# Patient Record
Sex: Male | Born: 1990
Health system: Southern US, Community
[De-identification: ages and names within clinical notes are randomized; demographics above are authoritative.]

## PROBLEM LIST (undated history)

## (undated) DIAGNOSIS — L700 Acne vulgaris: Secondary | ICD-10-CM

## (undated) DIAGNOSIS — F909 Attention-deficit hyperactivity disorder, unspecified type: Secondary | ICD-10-CM

## (undated) DIAGNOSIS — F419 Anxiety disorder, unspecified: Secondary | ICD-10-CM

## (undated) HISTORY — DX: Acne vulgaris: L70.0

## (undated) HISTORY — DX: Anxiety disorder, unspecified: F41.9

## (undated) HISTORY — DX: Attention-deficit hyperactivity disorder, unspecified type: F90.9

---
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
## 2010-01-11 ENCOUNTER — Telehealth: Payer: Self-pay | Admitting: Family Medicine

## 2010-02-13 NOTE — Progress Notes (Signed)
Summary: Stattera dosage?  Phone Note From Pharmacy   Caller: [facility]* Call For: Dr. Clent Ridges  Summary of Call: [facility] is calling questioning the Strattera dosage of 160 mg. daily.  She states the pt is telling her that he had not been taking his Strattera lately because......"he does not like the way it makes him feel".  Was Dr. Clent Ridges aware of this? Initial call taken by: Lynann Beaver CMA,  Jun 01, 2009 4:09 PM  Follow-up for Phone Call        Mom calls to make sure Dr. Clent Ridges knows pt has not been on any Strattera x one year.  Wants a different med.  Pharmacist did not want to fill this prescription [phone] Follow-up by: Lynann Beaver CMA,  Jun 01, 2009 4:19 PM  Additional Follow-up for Phone Call Additional follow up Details #1::        This is a completely different story than what the patient told me during our visit. He needs to come back in for another OV to discuss this, because now I have no idea what he wants  Additional Follow-up by: Nelwyn Salisbury MD,  Jun 02, 2009 8:27 AM    Additional Follow-up for Phone Call Additional follow up Details #2::    Appt scheduled. Follow-up by: Lynann Beaver CMA,  Jun 02, 2009 8:35 AM

## 2010-02-13 NOTE — Progress Notes (Signed)
Summary: new rx  Phone Note Refill Request Call back at [phone] Message from:  mom-kim  Refills Requested: Medication #1:  ADDERALL XR 20 MG XR24H-CAP once daily. pt needs new rx  Initial call taken by: Heron Sabins,  August 28, 2009 11:30 AM  Follow-up for Phone Call        done Follow-up by: Nelwyn Salisbury MD,  August 28, 2009 4:58 PM    New/Updated Medications: ADDERALL XR 20 MG XR24H-CAP (AMPHETAMINE-DEXTROAMPHETAMINE) once daily, may fill on 09-28-09 ADDERALL XR 20 MG XR24H-CAP (AMPHETAMINE-DEXTROAMPHETAMINE) once daily, may fill on 10-28-09 Prescriptions: ADDERALL XR 20 MG XR24H-CAP (AMPHETAMINE-DEXTROAMPHETAMINE) once daily, may fill on 10-28-09  #30 x 0   Entered and Authorized by:   Nelwyn Salisbury MD   Signed by:   Nelwyn Salisbury MD on 08/28/2009   Method used:   Print then Give to Patient   RxID:   [id number] ADDERALL XR 20 MG XR24H-CAP (AMPHETAMINE-DEXTROAMPHETAMINE) once daily, may fill on 09-28-09  #30 x 0   Entered and Authorized by:   Nelwyn Salisbury MD   Signed by:   Nelwyn Salisbury MD on 08/28/2009   Method used:   Print then Give to Patient   RxID:   [id number] ADDERALL XR 20 MG XR24H-CAP (AMPHETAMINE-DEXTROAMPHETAMINE) once daily  #30 x 0   Entered and Authorized by:   Nelwyn Salisbury MD   Signed by:   Nelwyn Salisbury MD on 08/28/2009   Method used:   Print then Give to Patient   RxID:   [id number]

## 2010-02-13 NOTE — Assessment & Plan Note (Signed)
Summary: discuss meds/dm   Vital Signs:  Patient profile:   20 year old male BP sitting:   126 / 88  (left arm) Cuff size:   regular  Vitals Entered By: Raechel Ache, RN (Jun 02, 2009 3:33 PM) CC: Talk about meds.   History of Present Illness: Here with his mother to clarify his status on anxiety and ADHD. I saw him a few days ago, when he misled me about his Strattera. He led me to believe that he had been taking it for several years uninterrupted, when he actually stopped taking it over a year ago. Today he says that he did not think it helped his attention at all, and in fact it made him very lethergic. He would like to try something different now for this problem. he is still on Effexor, and he is very pleased with how this takes care of his anxiety symptoms.   Allergies: No Known Drug Allergies  Past History:  Past Medical History: Reviewed history from 07/05/2008 and no changes required. Anxiety ADHD Osgood-Schlatters acne  Review of Systems  The patient denies anorexia, fever, weight loss, weight gain, vision loss, decreased hearing, hoarseness, chest pain, syncope, dyspnea on exertion, peripheral edema, prolonged cough, headaches, hemoptysis, abdominal pain, melena, hematochezia, severe indigestion/heartburn, hematuria, incontinence, genital sores, muscle weakness, suspicious skin lesions, transient blindness, difficulty walking, depression, unusual weight change, abnormal bleeding, enlarged lymph nodes, angioedema, breast masses, and testicular masses.    Physical Exam  General:  Well-developed,well-nourished,in no acute distress; alert,appropriate and cooperative throughout examination Neurologic:  No cranial nerve deficits noted. Station and gait are normal. Plantar reflexes are down-going bilaterally. DTRs are symmetrical throughout. Sensory, motor and coordinative functions appear intact. Psych:  Cognition and judgment appear intact. Alert and cooperative with  normal attention span and concentration. No apparent delusions, illusions, hallucinations   Impression & Recommendations:  Problem # 1:  ADHD (ICD-314.01)  Problem # 2:  ANXIETY (ICD-300.00)  His updated medication list for this problem includes:    Effexor Xr 150 Mg Cp24 (Venlafaxine hcl) ..... Once daily  Complete Medication List: 1)  Benzaclin 1-5 % Gel (Clindamycin phos-benzoyl perox) .... Two times a day 2)  Minocin 100 Mg Caps (Minocycline hcl) .Marland Kitchen.. 1 by mouth two times a day 3)  Effexor Xr 150 Mg Cp24 (Venlafaxine hcl) .... Once daily 4)  Adderall Xr 20 Mg Xr24h-cap (Amphetamine-dextroamphetamine) .... Once daily  Patient Instructions: 1)  We spent 40 minutes discussing various treatment options, and we all agreed to try a stimulant med. Try Adderall XR. Continue Effexor XR.  2)  Please schedule a follow-up appointment in 1 month.  Prescriptions: ADDERALL XR 20 MG XR24H-CAP (AMPHETAMINE-DEXTROAMPHETAMINE) once daily  #30 x 0   Entered and Authorized by:   Nelwyn Salisbury MD   Signed by:   Nelwyn Salisbury MD on 06/02/2009   Method used:   Print then Give to Patient   RxID:   [id number]

## 2010-02-13 NOTE — Progress Notes (Signed)
Summary: new rx  Phone Note Call from Patient Call back at Home Phone [phone] Call back at [phone]   Caller: Mom-kim Call For: Nelwyn Salisbury MD Summary of Call: pt needs new rx adderall xr 20 mg he has 2 pills left. mom is aware doc out of office Initial call taken by: Heron Sabins,  July 27, 2009 3:53 PM  Follow-up for Phone Call        done Follow-up by: Nelwyn Salisbury MD,  July 31, 2009 2:04 PM  Additional Follow-up for Phone Call Additional follow up Details #1::        called. Additional Follow-up by: Raechel Ache, RN,  July 31, 2009 2:12 PM    Prescriptions: ADDERALL XR 20 MG XR24H-CAP (AMPHETAMINE-DEXTROAMPHETAMINE) once daily  #30 x 0   Entered and Authorized by:   Nelwyn Salisbury MD   Signed by:   Nelwyn Salisbury MD on 07/31/2009   Method used:   Print then Give to Patient   RxID:   [id number]

## 2010-02-13 NOTE — Assessment & Plan Note (Signed)
Summary: fu on med/njr   Vital Signs:  Patient profile:   20 year old male Weight:      180 pounds BMI:     29.16 BP sitting:   132 / 84  (left arm) Cuff size:   regular  Vitals Entered By: Raechel Ache, RN (Jun 01, 2009 10:01 AM) CC: F/u meds   History of Present Illness: Here to discuss his meds. He has been back home from college for 2 weeks, and he is working part time for his church. His anxiety is well controlled, but he says the Strattera is not as effective as it once was. He would like to increase the dose if possible. No side effects to report.   Allergies: No Known Drug Allergies  Past History:  Past Medical History: Reviewed history from 07/05/2008 and no changes required. Anxiety ADHD Osgood-Schlatters acne  Review of Systems  The patient denies anorexia, fever, weight loss, weight gain, vision loss, decreased hearing, hoarseness, chest pain, syncope, dyspnea on exertion, peripheral edema, prolonged cough, headaches, hemoptysis, abdominal pain, melena, hematochezia, severe indigestion/heartburn, hematuria, incontinence, genital sores, muscle weakness, suspicious skin lesions, transient blindness, difficulty walking, depression, unusual weight change, abnormal bleeding, enlarged lymph nodes, angioedema, breast masses, and testicular masses.    Physical Exam  General:  Well-developed,well-nourished,in no acute distress; alert,appropriate and cooperative throughout examination Neurologic:  alert & oriented X3, cranial nerves II-XII intact, and gait normal.   Psych:  Cognition and judgment appear intact. Alert and cooperative with normal attention span and concentration. No apparent delusions, illusions, hallucinations   Impression & Recommendations:  Problem # 1:  ANXIETY (ICD-300.00)  His updated medication list for this problem includes:    Effexor Xr 150 Mg Cp24 (Venlafaxine hcl) ..... Once daily  Problem # 2:  ADHD (ICD-314.01)  Complete Medication  List: 1)  Benzaclin 1-5 % Gel (Clindamycin phos-benzoyl perox) .... Two times a day 2)  Strattera 100 Mg Caps (Atomoxetine hcl) .... Once daily 3)  Minocin 100 Mg Caps (Minocycline hcl) .Marland Kitchen.. 1 by mouth two times a day 4)  Strattera 100 Mg Caps (Atomoxetine hcl) .... Once daily 5)  Strattera 60 Mg Caps (Atomoxetine hcl) .... Once daily 6)  Effexor Xr 150 Mg Cp24 (Venlafaxine hcl) .... Once daily  Patient Instructions: 1)  We spent 30 minutes discussing these issues. Will increase Strattera to a total of 160 mg once daily .  2)  Please schedule a follow-up appointment in 2 weeks.  Prescriptions: STRATTERA 60 MG CAPS (ATOMOXETINE HCL) once daily  #30 x 11   Entered and Authorized by:   Nelwyn Salisbury MD   Signed by:   Nelwyn Salisbury MD on 06/01/2009   Method used:   Electronically to        [facility]* (retail)       [address]       [place], [place]  [address]       Ph: [phone]       Fax: [phone]   RxID:   [id number] STRATTERA 100 MG CAPS (ATOMOXETINE HCL) once daily  #30 x 11   Entered and Authorized by:   Nelwyn Salisbury MD   Signed by:   Nelwyn Salisbury MD on 06/01/2009   Method used:   Electronically to        [facility]* (retail)       [address]       [place], [place]  [id number]  Ph: [phone]       Fax: [phone]   RxID:   [id number]

## 2010-02-13 NOTE — Assessment & Plan Note (Signed)
Summary: flu shot//lch  Nurse Visit   Allergies: No Known Drug Allergies  Orders Added: 1)  Admin 1st Vaccine [90471] 2)  Flu Vaccine 41yrs + [04540]  Review of Systems       Flu Vaccine Consent Questions     Do you have a history of severe allergic reactions to this vaccine? no    Any prior history of allergic reactions to egg and/or gelatin? no    Do you have a sensitivity to the preservative Thimersol? no    Do you have a past history of Guillan-Barre Syndrome? no    Do you currently have an acute febrile illness? no    Have you ever had a severe reaction to latex? no    Vaccine information given and explained to patient? yes    Are you currently pregnant? no    Lot Number:[id number]   Exp Date:07/14/2010   Site Given  Left Deltoid IM Pura Spice, RN  October 27, 2009 4:07 PM

## 2010-02-13 NOTE — Progress Notes (Signed)
Summary: new rx  Phone Note Refill Request Call back at [phone] Message from:  mom-kim  Refills Requested: Medication #1:  ADDERALL XR 20 MG XR24H-CAP once daily Please call mom when ready for pick up  Initial call taken by: Heron Sabins,  December 01, 2009 10:31 AM  Follow-up for Phone Call        due to availability issues, try Vyvanse for one month Follow-up by: Nelwyn Salisbury MD,  December 01, 2009 2:37 PM  Additional Follow-up for Phone Call Additional follow up Details #1::        left mess rx ready  Additional Follow-up by: Pura Spice, RN,  December 01, 2009 3:11 PM    New/Updated Medications: VYVANSE 20 MG CAPS (LISDEXAMFETAMINE DIMESYLATE) once daily Prescriptions: VYVANSE 20 MG CAPS (LISDEXAMFETAMINE DIMESYLATE) once daily  #30 x 0   Entered and Authorized by:   Nelwyn Salisbury MD   Signed by:   Nelwyn Salisbury MD on 12/01/2009   Method used:   Print then Give to Patient   RxID:   [id number]

## 2010-02-13 NOTE — Progress Notes (Signed)
Summary: Pt req script for Adderall XR 20mg   Phone Note Call from Patient Call back at [phone] Kims cell   Caller: MOM-Kimberly Summary of Call: Pt req script for Adderall XR 20mg . Pt only has 2 pills lft. Pls notify when ready for pick up.  Initial call taken by: Lucy Antigua,  June 28, 2009 3:40 PM  Follow-up for Phone Call        done Follow-up by: Nelwyn Salisbury MD,  June 29, 2009 8:35 AM  Additional Follow-up for Phone Call Additional follow up Details #1::        called. Additional Follow-up by: Raechel Ache, RN,  June 29, 2009 8:42 AM    Prescriptions: ADDERALL XR 20 MG XR24H-CAP (AMPHETAMINE-DEXTROAMPHETAMINE) once daily  #30 x 0   Entered and Authorized by:   Nelwyn Salisbury MD   Signed by:   Nelwyn Salisbury MD on 06/29/2009   Method used:   Print then Give to Patient   RxID:   [id number]

## 2010-02-13 NOTE — Progress Notes (Signed)
Summary: no show  Phone Note Other Incoming   Summary of Call: no show for appt Initial call taken by: Raechel Ache, RN,  June 15, 2009 9:18 AM  Follow-up for Phone Call        charge the NS fee Follow-up by: Nelwyn Salisbury MD,  June 15, 2009 10:01 AM

## 2010-02-15 NOTE — Progress Notes (Signed)
Summary: Pt needs script for Adderall.   Phone Note Refill Request Call back at [phone] [facility] from:  mom Selena Batten on January 11, 2010 1:42 PM  Refills Requested: Medication #1:  ADDERALL XR 20 MG XR24H-CAP once daily.   Dosage confirmed as above?Dosage Confirmed   Supply Requested: 1 month  Method Requested: Pick up at Office Initial call taken by: Lucy Antigua,  January 11, 2010 1:42 PM  Follow-up for Phone Call        done Follow-up by: Nelwyn Salisbury MD,  January 12, 2010 4:32 PM  Additional Follow-up for Phone Call Additional follow up Details #1::        left mess that rx ready for pick up  Additional Follow-up by: Pura Spice, RN,  January 12, 2010 4:48 PM    New/Updated Medications: ADDERALL XR 20 MG XR24H-CAP (AMPHETAMINE-DEXTROAMPHETAMINE) once daily, may fill on 02-12-10 ADDERALL XR 20 MG XR24H-CAP (AMPHETAMINE-DEXTROAMPHETAMINE) once daily, may fill on 03-13-10 Prescriptions: ADDERALL XR 20 MG XR24H-CAP (AMPHETAMINE-DEXTROAMPHETAMINE) once daily, may fill on 03-13-10  #30 x 0   Entered and Authorized by:   Nelwyn Salisbury MD   Signed by:   Nelwyn Salisbury MD on 01/12/2010   Method used:   Print then Mail to Patient   RxID:   [id number] ADDERALL XR 20 MG XR24H-CAP (AMPHETAMINE-DEXTROAMPHETAMINE) once daily, may fill on 02-12-10  #30 x 0   Entered and Authorized by:   Nelwyn Salisbury MD   Signed by:   Nelwyn Salisbury MD on 01/12/2010   Method used:   Print then Mail to Patient   RxID:   [id number] ADDERALL XR 20 MG XR24H-CAP (AMPHETAMINE-DEXTROAMPHETAMINE) once daily  #30 x 0   Entered and Authorized by:   Nelwyn Salisbury MD   Signed by:   Nelwyn Salisbury MD on 01/12/2010   Method used:   Print then Mail to Patient   RxID:   [id number]

## 2010-05-09 ENCOUNTER — Other Ambulatory Visit: Payer: Self-pay | Admitting: Family Medicine

## 2010-05-09 MED ORDER — AMPHETAMINE-DEXTROAMPHET ER 20 MG PO CP24: 20.0000 mg | ORAL_CAPSULE | ORAL | Status: DC

## 2010-05-09 NOTE — Telephone Encounter (Signed)
rx up front ready for p/u, pt aware 

## 2010-05-09 NOTE — Telephone Encounter (Signed)
Done for one month only. After that he will need an OV since we need to see him at least  once a year

## 2010-05-09 NOTE — Telephone Encounter (Signed)
Pt needs Adderall XR 20 mg x3 scripts.

## 2010-05-11 ENCOUNTER — Telehealth: Payer: Self-pay | Admitting: Family Medicine

## 2010-05-11 NOTE — Telephone Encounter (Signed)
Error already processed/njr

## 2010-05-24 ENCOUNTER — Encounter: Payer: Self-pay | Admitting: Family Medicine

## 2010-05-24 ENCOUNTER — Ambulatory Visit (INDEPENDENT_AMBULATORY_CARE_PROVIDER_SITE_OTHER): Payer: Managed Care, Other (non HMO) | Admitting: Family Medicine

## 2010-05-24 VITALS — BP 128/76 | HR 71 | Temp 98.1°F | Resp 16 | Wt 169.5 lb

## 2010-05-24 DIAGNOSIS — F419 Anxiety disorder, unspecified: Secondary | ICD-10-CM

## 2010-05-24 DIAGNOSIS — F411 Generalized anxiety disorder: Secondary | ICD-10-CM

## 2010-05-24 DIAGNOSIS — F909 Attention-deficit hyperactivity disorder, unspecified type: Secondary | ICD-10-CM

## 2010-05-24 MED ORDER — AMPHETAMINE-DEXTROAMPHET ER 20 MG PO CP24: 20.0000 mg | ORAL_CAPSULE | ORAL | Status: DC

## 2010-05-24 NOTE — Progress Notes (Signed)
  Subjective:    Patient ID: Timothy Vega, male    DOB: Aug 24, 1990, 20 y.o.   MRN: [id number]  HPI Here to follow up on anxiety and ADHD. He is doing well and wants to continue his meds. He is about to finish up his sophomore year of college, and he has a job lined up for this summer.    Review of Systems  Constitutional: Negative.   Respiratory: Negative.   Cardiovascular: Negative.   Neurological: Negative.   Psychiatric/Behavioral: Negative.        Objective:   Physical Exam  Constitutional: He is oriented to person, place, and time. He appears well-developed and well-nourished.  Cardiovascular: Normal rate, regular rhythm, normal heart sounds and intact distal pulses.   Pulmonary/Chest: Effort normal and breath sounds normal.  Neurological: He is alert and oriented to person, place, and time.  Psychiatric: He has a normal mood and affect. His behavior is normal.          Assessment & Plan:  He doing well. Meds were refilled

## 2010-06-15 ENCOUNTER — Ambulatory Visit (INDEPENDENT_AMBULATORY_CARE_PROVIDER_SITE_OTHER): Payer: Managed Care, Other (non HMO) | Admitting: Family Medicine

## 2010-06-15 ENCOUNTER — Encounter: Payer: Self-pay | Admitting: Family Medicine

## 2010-06-15 VITALS — BP 124/84 | Temp 99.6°F | Wt 167.0 lb

## 2010-06-15 DIAGNOSIS — M25519 Pain in unspecified shoulder: Secondary | ICD-10-CM

## 2010-06-17 ENCOUNTER — Encounter: Payer: Self-pay | Admitting: Family Medicine

## 2010-06-17 NOTE — Progress Notes (Signed)
  Subjective:    Patient ID: Timothy Vega, male    DOB: 07/21/1990, 20 y.o.   MRN: [id number]  HPI Here for one week of right shoulder pain. No recent trauma, but he has been playing a lot of sports lately. No neck or back pain. Motrin helps.   Review of Systems  Constitutional: Negative.   Musculoskeletal: Positive for arthralgias.       Objective:   Physical Exam  Constitutional: He appears well-developed and well-nourished.  Musculoskeletal:       The right shoulder is tender posteriorly just above the scapular spine, full ROM          Assessment & Plan:  Rest, heat, Motrin. Recheck prn

## 2010-09-19 ENCOUNTER — Telehealth: Payer: Self-pay | Admitting: Family Medicine

## 2010-09-19 NOTE — Telephone Encounter (Signed)
Pt's mom calling in, requesting Rx for Adderall. Please call when available for pick up.

## 2010-09-20 MED ORDER — AMPHETAMINE-DEXTROAMPHET ER 20 MG PO CP24: 20.0000 mg | ORAL_CAPSULE | ORAL | Status: DC

## 2010-09-20 NOTE — Telephone Encounter (Signed)
Left voice message, scripts are ready for pick up.

## 2010-09-20 NOTE — Telephone Encounter (Signed)
Done

## 2010-10-15 ENCOUNTER — Ambulatory Visit (INDEPENDENT_AMBULATORY_CARE_PROVIDER_SITE_OTHER): Payer: Managed Care, Other (non HMO)

## 2010-10-15 DIAGNOSIS — Z23 Encounter for immunization: Secondary | ICD-10-CM

## 2010-11-12 ENCOUNTER — Encounter: Payer: Self-pay | Admitting: Family Medicine

## 2010-11-12 ENCOUNTER — Ambulatory Visit (INDEPENDENT_AMBULATORY_CARE_PROVIDER_SITE_OTHER): Payer: Managed Care, Other (non HMO) | Admitting: Family Medicine

## 2010-11-12 VITALS — BP 132/90 | HR 74 | Temp 99.1°F | Wt 174.0 lb

## 2010-11-12 DIAGNOSIS — J40 Bronchitis, not specified as acute or chronic: Secondary | ICD-10-CM

## 2010-11-12 MED ORDER — AZITHROMYCIN 250 MG PO TABS: ORAL_TABLET | ORAL | Status: AC

## 2010-11-12 MED ORDER — HYDROCODONE-HOMATROPINE 5-1.5 MG/5ML PO SYRP: 5.0000 mL | ORAL_SOLUTION | ORAL | Status: DC | PRN

## 2010-11-12 NOTE — Progress Notes (Signed)
  Subjective:    Patient ID: Timothy Vega, male    DOB: 03-May-1990, 20 y.o.   MRN: [id number]  HPI Here for 3 weeks of chest congestion and coughing up green sputum. No fever. On Delsym.    Review of Systems  Constitutional: Negative.   HENT: Negative.   Eyes: Negative.   Respiratory: Positive for cough.        Objective:   Physical Exam  Constitutional: He appears well-developed and well-nourished.  HENT:  Right Ear: External ear normal.  Left Ear: External ear normal.  Nose: Nose normal.  Mouth/Throat: No oropharyngeal exudate.  Eyes: Pupils are equal, round, and reactive to light.  Neck: No thyromegaly present.  Pulmonary/Chest: Effort normal and breath sounds normal. No respiratory distress. He has no wheezes. He has no rales. He exhibits no tenderness.  Lymphadenopathy:    He has no cervical adenopathy.          Assessment & Plan:  Recheck prn

## 2010-11-19 ENCOUNTER — Telehealth: Payer: Self-pay | Admitting: *Deleted

## 2010-11-19 NOTE — Telephone Encounter (Signed)
Mom calls stating pt needs a new cough med as the Delsym is not helping at all.  [place].

## 2010-11-20 NOTE — Telephone Encounter (Signed)
Call in another bottle of Hydromet 

## 2010-11-21 MED ORDER — HYDROCODONE-HOMATROPINE 5-1.5 MG/5ML PO SYRP: 5.0000 mL | ORAL_SOLUTION | ORAL | Status: AC | PRN

## 2010-11-21 NOTE — Telephone Encounter (Signed)
rx called into pharmacy

## 2010-11-22 ENCOUNTER — Telehealth: Payer: Self-pay | Admitting: Family Medicine

## 2010-11-22 NOTE — Telephone Encounter (Signed)
Mom called and said that script was not ready. I called pharmacy this morning and gave a verbal order. Left voice message for pt.

## 2010-12-05 ENCOUNTER — Other Ambulatory Visit: Payer: Self-pay | Admitting: Family Medicine

## 2010-12-05 MED ORDER — AMPHETAMINE-DEXTROAMPHET ER 20 MG PO CP24: 20.0000 mg | ORAL_CAPSULE | ORAL | Status: DC

## 2010-12-05 MED ORDER — VENLAFAXINE HCL ER 150 MG PO CP24: 150.0000 mg | ORAL_CAPSULE | Freq: Every day | ORAL | Status: DC

## 2010-12-05 NOTE — Telephone Encounter (Signed)
rx for Effexor sent to pharmacy.

## 2010-12-05 NOTE — Telephone Encounter (Signed)
The Adderall was written. Call in a one year supply of Effexor

## 2010-12-05 NOTE — Telephone Encounter (Signed)
Pt needs refill of amphetamine-dextroamphetamine (ADDERALL XR, 20MG ,) 20 MG 24 hr capsule x 3, venlafaxine (EFFEXOR-XR) 150 MG 24 hr capsule

## 2011-03-12 ENCOUNTER — Telehealth: Payer: Self-pay | Admitting: Family Medicine

## 2011-03-12 NOTE — Telephone Encounter (Signed)
Pt needs refill on Adderall. Please call when ready for pick up - thanks!

## 2011-03-13 MED ORDER — AMPHETAMINE-DEXTROAMPHET ER 20 MG PO CP24: 20.0000 mg | ORAL_CAPSULE | ORAL | Status: DC

## 2011-03-13 NOTE — Telephone Encounter (Signed)
Scripts ready and left voice message. 

## 2011-03-13 NOTE — Telephone Encounter (Signed)
done

## 2011-04-11 ENCOUNTER — Ambulatory Visit (INDEPENDENT_AMBULATORY_CARE_PROVIDER_SITE_OTHER): Payer: Managed Care, Other (non HMO) | Admitting: Family Medicine

## 2011-04-11 ENCOUNTER — Encounter: Payer: Self-pay | Admitting: Family Medicine

## 2011-04-11 VITALS — BP 126/80 | HR 113 | Temp 98.4°F | Wt 158.0 lb

## 2011-04-11 DIAGNOSIS — J329 Chronic sinusitis, unspecified: Secondary | ICD-10-CM

## 2011-04-11 MED ORDER — CEPHALEXIN 500 MG PO CAPS: 500.0000 mg | ORAL_CAPSULE | Freq: Three times a day (TID) | ORAL | Status: AC

## 2011-04-11 NOTE — Progress Notes (Signed)
  Subjective:    Patient ID: Timothy Vega, male    DOB: August 30, 1990, 20 y.o.   MRN: [id number]  HPI Here for 3 days of sinus pressure, PND, and a bad ST. No cough or fever.    Review of Systems  Constitutional: Negative.   HENT: Positive for congestion, sore throat, postnasal drip and sinus pressure. Negative for trouble swallowing.   Eyes: Negative.   Respiratory: Negative.        Objective:   Physical Exam  Constitutional: He appears well-developed and well-nourished.  HENT:  Right Ear: External ear normal.  Left Ear: External ear normal.  Nose: Nose normal.  Mouth/Throat: Oropharynx is clear and moist. No oropharyngeal exudate.  Eyes: Conjunctivae are normal.  Pulmonary/Chest: Effort normal and breath sounds normal.  Lymphadenopathy:    He has no cervical adenopathy.          Assessment & Plan:  Add Mucinex and Motrin

## 2011-06-18 ENCOUNTER — Telehealth: Payer: Self-pay | Admitting: Family Medicine

## 2011-06-18 MED ORDER — AMPHETAMINE-DEXTROAMPHET ER 20 MG PO CP24: 20.0000 mg | ORAL_CAPSULE | ORAL | Status: DC

## 2011-06-18 NOTE — Telephone Encounter (Signed)
done

## 2011-06-18 NOTE — Telephone Encounter (Signed)
Pt needs 3 new Rx's of Adderall. Please call mom when ready to pick up.

## 2011-06-18 NOTE — Telephone Encounter (Signed)
Script is ready for pick up and spoke with pt. 

## 2011-08-05 ENCOUNTER — Ambulatory Visit (INDEPENDENT_AMBULATORY_CARE_PROVIDER_SITE_OTHER): Payer: Managed Care, Other (non HMO) | Admitting: Family Medicine

## 2011-08-05 ENCOUNTER — Encounter: Payer: Self-pay | Admitting: Family Medicine

## 2011-08-05 VITALS — BP 128/86 | HR 88 | Temp 98.3°F | Wt 165.0 lb

## 2011-08-05 DIAGNOSIS — S91309A Unspecified open wound, unspecified foot, initial encounter: Secondary | ICD-10-CM

## 2011-08-05 DIAGNOSIS — S91319A Laceration without foreign body, unspecified foot, initial encounter: Secondary | ICD-10-CM

## 2011-08-05 NOTE — Progress Notes (Signed)
  Subjective:    Patient ID: Timothy Vega, male    DOB: [birth year], 21 y.o.   MRN: [id number]  HPI Here to have sutures checked. [place] he stepped on a broken oyster shell and got a laceration on the sole of the right foot. He went to a local ER and had sutures placed. He was also given a 10 day course of Augmentin. The area is still a little sore but feels much better.    Review of Systems  Constitutional: Negative.        Objective:   Physical Exam  Constitutional: He appears well-developed and well-nourished.  Skin:       The wound on the right sole looks clean with 2 mattress sutures in place. Slightly tender. About 1.5 cm long.           Assessment & Plan:  Both sutures were removed today. Finish out the antibiotic and recheck prn

## 2011-08-07 ENCOUNTER — Encounter: Payer: Self-pay | Admitting: Family Medicine

## 2011-08-07 ENCOUNTER — Ambulatory Visit (INDEPENDENT_AMBULATORY_CARE_PROVIDER_SITE_OTHER): Payer: Managed Care, Other (non HMO) | Admitting: Family Medicine

## 2011-08-07 VITALS — BP 128/80 | HR 90 | Temp 98.8°F | Wt 165.0 lb

## 2011-08-07 DIAGNOSIS — M546 Pain in thoracic spine: Secondary | ICD-10-CM

## 2011-08-07 MED ORDER — CYCLOBENZAPRINE HCL 10 MG PO TABS: 10.0000 mg | ORAL_TABLET | Freq: Three times a day (TID) | ORAL | Status: AC | PRN

## 2011-08-07 MED ORDER — DICLOFENAC SODIUM 75 MG PO TBEC: 75.0000 mg | DELAYED_RELEASE_TABLET | Freq: Two times a day (BID) | ORAL | Status: DC

## 2011-08-07 NOTE — Progress Notes (Signed)
  Subjective:    Patient ID: Timothy Vega, male    DOB: May 27, 1990, 20 y.o.   MRN: [id number]  HPI Here for one week of stiffness and pain in the middle back. No recent trauma. Using heat and Aleve. His job is fairly physical with a lot of reaching and lifting, so he was not able to work today.   Review of Systems  Constitutional: Negative.   Gastrointestinal: Negative.   Genitourinary: Negative.   Musculoskeletal: Positive for back pain.       Objective:   Physical Exam  Constitutional: He appears well-developed and well-nourished.  Musculoskeletal:       Tender with spasms in the middle thoracic region of the back. ROM is reduced           Assessment & Plan:  Off work today and tomorrow. Rest, heat, Flexeril, and Diclofenac prn

## 2011-09-30 ENCOUNTER — Telehealth: Payer: Self-pay | Admitting: Family Medicine

## 2011-09-30 MED ORDER — AMPHETAMINE-DEXTROAMPHET ER 20 MG PO CP24: 20.0000 mg | ORAL_CAPSULE | ORAL | Status: DC

## 2011-09-30 NOTE — Telephone Encounter (Signed)
Pt requesting to pick up (3) 30-day rx for Adderall. Please call when ready for pick up. Also, please put a DPR in there as well, for mother, Selena Batten.

## 2011-09-30 NOTE — Telephone Encounter (Signed)
done

## 2011-09-30 NOTE — Telephone Encounter (Signed)
Script is ready for pick up and left voice message. Pt must fill out form while he is here for release of information.

## 2011-10-07 ENCOUNTER — Ambulatory Visit: Payer: Managed Care, Other (non HMO) | Admitting: Family Medicine

## 2011-10-09 ENCOUNTER — Ambulatory Visit (INDEPENDENT_AMBULATORY_CARE_PROVIDER_SITE_OTHER): Payer: Managed Care, Other (non HMO) | Admitting: Internal Medicine

## 2011-10-09 ENCOUNTER — Ambulatory Visit: Payer: Managed Care, Other (non HMO)

## 2011-10-09 ENCOUNTER — Encounter: Payer: Self-pay | Admitting: Internal Medicine

## 2011-10-09 VITALS — BP 132/92 | Temp 98.0°F | Wt 154.0 lb

## 2011-10-09 DIAGNOSIS — J069 Acute upper respiratory infection, unspecified: Secondary | ICD-10-CM

## 2011-10-09 MED ORDER — HYDROCODONE-HOMATROPINE 5-1.5 MG/5ML PO SYRP: 5.0000 mL | ORAL_SOLUTION | Freq: Two times a day (BID) | ORAL | Status: DC | PRN

## 2011-10-09 NOTE — Assessment & Plan Note (Signed)
21 year old white male with signs and symptoms of viral upper respiratory infection. I suggested symptomatic treatment. Patient to call office if symptoms persist or worsen.

## 2011-10-09 NOTE — Patient Instructions (Addendum)
Use saline nasal spray 3-4 time daily as directed Take Mucinex DM twice daily as needed for cough Use tylenol 650 mg every 8 hrs as needed Increase fluid intake Please call our office if your symptoms do not improve or gets worse.

## 2011-10-09 NOTE — Progress Notes (Signed)
  Subjective:    Patient ID: Timothy Vega, male    DOB: 08-05-1990, 21 y.o.   MRN: [id number]  URI  This is a new problem. The current episode started in the past 7 days. There has been no fever. Associated symptoms include congestion and coughing. Pertinent negatives include no neck pain, sinus pain or wheezing. He has tried nothing for the symptoms.      Review of Systems  HENT: Positive for congestion. Negative for neck pain.   Respiratory: Positive for cough. Negative for wheezing.    Past Medical History  Diagnosis Date  . ADHD (attention deficit hyperactivity disorder)   . Anxiety     History   Social History  . Marital Status: Single    Spouse Name: N/A    Number of Children: N/A  . Years of Education: N/A   Occupational History  . Not on file.   Social History Main Topics  . Smoking status: Never Smoker   . Smokeless tobacco: Never Used  . Alcohol Use: 0.5 oz/week    1 drink(s) per week  . Drug Use: No  . Sexually Active: Not on file   Other Topics Concern  . Not on file   Social History Narrative  . No narrative on file    No past surgical history on file.  No family history on file.  No Known Allergies  Current Outpatient Prescriptions on File Prior to Visit  Medication Sig Dispense Refill  . amphetamine-dextroamphetamine (ADDERALL XR) 20 MG 24 hr capsule Take 1 capsule (20 mg total) by mouth every morning.  30 capsule  0  . venlafaxine (EFFEXOR-XR) 150 MG 24 hr capsule Take 1 capsule (150 mg total) by mouth daily.  30 capsule  11    BP 132/92  Temp 98 F (36.7 C) (Oral)  Wt 154 lb (69.854 kg)        Objective:   Physical Exam  Constitutional: He is oriented to person, place, and time. He appears well-developed and well-nourished.  HENT:  Head: Normocephalic and atraumatic.  Mouth/Throat: No oropharyngeal exudate.       Mild retraction of both tympanic membranes Mild oropharyngeal erythema  Eyes: EOM are normal. Pupils are  equal, round, and reactive to light.  Neck: Neck supple.  Cardiovascular: Normal rate, regular rhythm and normal heart sounds.   Pulmonary/Chest: Effort normal and breath sounds normal. He has no wheezes.  Lymphadenopathy:    He has no cervical adenopathy.  Neurological: He is alert and oriented to person, place, and time.          Assessment & Plan:

## 2011-12-31 ENCOUNTER — Ambulatory Visit (INDEPENDENT_AMBULATORY_CARE_PROVIDER_SITE_OTHER): Payer: Managed Care, Other (non HMO) | Admitting: Family Medicine

## 2011-12-31 ENCOUNTER — Encounter: Payer: Self-pay | Admitting: Family Medicine

## 2011-12-31 VITALS — BP 126/88 | HR 72 | Temp 98.3°F | Wt 154.0 lb

## 2011-12-31 DIAGNOSIS — F411 Generalized anxiety disorder: Secondary | ICD-10-CM

## 2011-12-31 DIAGNOSIS — Z23 Encounter for immunization: Secondary | ICD-10-CM

## 2011-12-31 DIAGNOSIS — F909 Attention-deficit hyperactivity disorder, unspecified type: Secondary | ICD-10-CM

## 2011-12-31 NOTE — Progress Notes (Signed)
  Subjective:    Patient ID: Timothy Vega, male    DOB: 30-Nov-1990, 21 y.o.   MRN: [id number]  HPI Here to follow up on anxiety and ADHD. He stopped taking Effexor about one month ago, and he has done well. He had some shakes and dizziness for a week or two but this has stopped. He feels great now and his anxiety is well controlled. His ADHD is stable. He has been seeing Dr. Campbell Stall for facial acne and she wants to start him on Accutane. She wants to be sure he is stable both emotionally and physcially before she starts this.    Review of Systems  Constitutional: Negative.   Respiratory: Negative.   Cardiovascular: Negative.   Neurological: Negative.   Psychiatric/Behavioral: Negative.        Objective:   Physical Exam  Constitutional: He is oriented to person, place, and time. He appears well-developed and well-nourished.  Cardiovascular: Normal rate, regular rhythm, normal heart sounds and intact distal pulses.   Neurological: He is alert and oriented to person, place, and time.  Skin:       Cystic acne over the face   Psychiatric: He has a normal mood and affect. His behavior is normal. Thought content normal.          Assessment & Plan:  He has done well off of Effexor, and I think it would be fine to go ahead an try the Accutane immediately. I wrote a note saying this for him to take to Dr. Danella Deis.

## 2012-01-30 ENCOUNTER — Encounter (HOSPITAL_COMMUNITY): Payer: Self-pay | Admitting: Emergency Medicine

## 2012-01-30 ENCOUNTER — Emergency Department (HOSPITAL_COMMUNITY)
Admission: EM | Admit: 2012-01-30 | Discharge: 2012-01-30 | Disposition: A | Discharge status: Home / Self Care | Payer: Managed Care, Other (non HMO) | Attending: Emergency Medicine | Admitting: Emergency Medicine

## 2012-01-30 DIAGNOSIS — L299 Pruritus, unspecified: Secondary | ICD-10-CM | POA: Insufficient documentation

## 2012-01-30 DIAGNOSIS — F411 Generalized anxiety disorder: Secondary | ICD-10-CM | POA: Insufficient documentation

## 2012-01-30 DIAGNOSIS — Z79899 Other long term (current) drug therapy: Secondary | ICD-10-CM | POA: Insufficient documentation

## 2012-01-30 DIAGNOSIS — F909 Attention-deficit hyperactivity disorder, unspecified type: Secondary | ICD-10-CM | POA: Insufficient documentation

## 2012-01-30 DIAGNOSIS — R21 Rash and other nonspecific skin eruption: Secondary | ICD-10-CM

## 2012-01-30 MED ORDER — FAMOTIDINE 20 MG PO TABS
20.0000 mg | ORAL_TABLET | Freq: Once | ORAL | Status: AC
  Administered 2012-01-30: 20 mg via ORAL
  Filled 2012-01-30: qty 1

## 2012-01-30 MED ORDER — HYDROXYZINE HCL 25 MG PO TABS: 25.0000 mg | ORAL_TABLET | Freq: Four times a day (QID) | ORAL | Status: DC

## 2012-01-30 MED ORDER — DIPHENHYDRAMINE HCL 50 MG/ML IJ SOLN
25.0000 mg | Freq: Once | INTRAMUSCULAR | Status: AC
  Administered 2012-01-30: 25 mg via INTRAMUSCULAR
  Filled 2012-01-30: qty 1

## 2012-01-30 MED ORDER — DIPHENHYDRAMINE HCL 25 MG PO CAPS: 50.0000 mg | ORAL_CAPSULE | Freq: Every evening | ORAL | Status: DC | PRN

## 2012-01-30 MED ORDER — METHYLPREDNISOLONE SODIUM SUCC 125 MG IJ SOLR
125.0000 mg | Freq: Once | INTRAMUSCULAR | Status: AC
  Administered 2012-01-30: 125 mg via INTRAMUSCULAR
  Filled 2012-01-30: qty 2

## 2012-01-30 NOTE — ED Notes (Signed)
Pt complains of "itching and breakouts to face" Pt states that he has been seen by a dermatologist and was placed on medications. Pt had an allergic reaction to medications and was then diagnosed with a bacterial infection. Pt is currently on day 2 of Cipro and states " This is getting worse"

## 2012-01-30 NOTE — ED Provider Notes (Signed)
History     CSN: [id number]  Arrival date & time 01/30/12  1323   First MD Initiated Contact with Patient 01/30/12 1325      Chief Complaint  Patient presents with  . Rash    (Consider location/radiation/quality/duration/timing/severity/associated sxs/prior treatment) HPI Comments: Patient is a 22 year old male with a 2 week history of rash. The rash started gradually and progressively worsened since the onset. The rash is located on his face. Patient has tried doxycycline and cipro without relief. The antibiotics seem to make the rash worse.  Patient is being seen by a Dermatologist, who was not able to see him today. Patient denies new exposures to soaps, lotions, detergent. Patient reports associated itching. No aggravating/alleviating factors. Patient denies fever, chills, NVD, sore throat, oral lesions, ocular involvement, throat closing, wheezing, SOB, chest pain, abdominal pain.      Past Medical History  Diagnosis Date  . ADHD (attention deficit hyperactivity disorder)   . Anxiety     History reviewed. No pertinent past surgical history.  No family history on file.  History  Substance Use Topics  . Smoking status: Never Smoker   . Smokeless tobacco: Never Used  . Alcohol Use: 0.5 oz/week    1 drink(s) per week      Review of Systems  Skin: Positive for rash.  All other systems reviewed and are negative.    Allergies  Doxycycline  Home Medications   Current Outpatient Rx  Name  Route  Sig  Dispense  Refill  . AMPHETAMINE-DEXTROAMPHET ER 20 MG PO CP24   Oral   Take 1 capsule (20 mg total) by mouth every morning.   30 capsule   0     May fill on 11-30-11   . CIPROFLOXACIN HCL 500 MG PO TABS   Oral   Take 500 mg by mouth 2 (two) times daily. Started on 01-28-11 for 10 day therapy         . VENLAFAXINE HCL ER 150 MG PO CP24   Oral   Take 1 capsule (150 mg total) by mouth daily.   30 capsule   11     BP 145/90  Pulse 86  Temp 97.6 F (36.4  C) (Oral)  Resp 20  SpO2 100%  Physical Exam  Nursing note and vitals reviewed. Constitutional: He is oriented to person, place, and time. He appears well-developed and well-nourished. No distress.  HENT:  Head: Normocephalic and atraumatic.       Generalized face reveals erythema, dry and flaking skin with scattered pustules.   Eyes: Conjunctivae normal are normal.  Cardiovascular: Normal rate and regular rhythm.  Exam reveals no gallop and no friction rub.   No murmur heard. Pulmonary/Chest: Effort normal and breath sounds normal. He has no wheezes. He has no rales. He exhibits no tenderness.  Abdominal: Soft. There is no tenderness.  Musculoskeletal: Normal range of motion.  Neurological: He is alert and oriented to person, place, and time.       Speech is goal-oriented. Moves limbs without ataxia.   Skin: Skin is warm and dry.  Psychiatric: He has a normal mood and affect. His behavior is normal.    ED Course  Procedures (including critical care time)  Labs Reviewed - No data to display No results found.   1. Rash and nonspecific skin eruption       MDM  2:00 PM Patient will be treated as an allergic reaction to attempt relief from itching.   2:44  PM Patient reports relief after the pepcid, benadryl and solumedrol. I will discharge the patient with atarax and benadryl. Patient will have recommended follow up with your dermatologist. No further evaluation needed at this time.       Emilia Beck, PA-C 01/31/12 1538

## 2012-01-30 NOTE — ED Notes (Signed)
MD at bedside. 

## 2012-01-31 NOTE — ED Provider Notes (Signed)
Medical screening examination/treatment/procedure(s) were performed by non-physician practitioner and as supervising physician I was immediately available for consultation/collaboration.   Lyanne Co, MD 01/31/12 1600

## 2012-02-04 ENCOUNTER — Other Ambulatory Visit: Payer: Self-pay | Admitting: Family Medicine

## 2012-02-04 ENCOUNTER — Ambulatory Visit (HOSPITAL_COMMUNITY)
Admission: RE | Admit: 2012-02-04 | Discharge: 2012-02-04 | Disposition: A | Discharge status: Home / Self Care | Payer: 59 | Attending: Psychiatry | Admitting: Psychiatry

## 2012-02-04 ENCOUNTER — Encounter (HOSPITAL_COMMUNITY): Payer: Self-pay | Admitting: *Deleted

## 2012-02-04 DIAGNOSIS — F410 Panic disorder [episodic paroxysmal anxiety] without agoraphobia: Secondary | ICD-10-CM | POA: Insufficient documentation

## 2012-02-04 NOTE — BH Assessment (Addendum)
Assessment Note   Timothy Vega is an 22 y.o. male. Pt presents for assessment of anger, crying, mood swings, depression and feelings of worthlessness. Pt is Alert and oriented dressed appropriately for season, pleasant, anxious, cooperative and well spoken. Pt Dx with facial skin infection 12/19/2011 (appearance of severe acne), treatment with antibiotics has been problematic with allergic reactions and diarrhea. Pt has been getting progressively more depressed and having anxiety panic episodes. Episodes include anger, screaming cursing, nausea, beating head with his hands.He is guilty about his actions frightening his mother and about not being himself. States he has not been able to feel any joy since mid December.  Also reports crying daily, angry whenever he sees other people be happy, avoiding friends. He listens to music to distract himself but is being interrupted by hearing voices, can't identify words but he imagines are critical of how he looks. Attending college, has 5 roommates and mother is supportive. No frank SI during panic attacks feels it would be better to die, no intent or plan. No HI now or in past. Discussed case with Thurman Coyer RN [facility] and Sheela Stack MD, pt referred to Psych IOP, message left for Jeri Modena of IOP. Does not have Advanced Directives and too overwhelmed to consider today. Given suicide prevention information and aware of 24 hour availability of emergency services here, in EDs and [place]. Pt seen for MSE by Sheela Stack MD, see ROS in MD progress note.  Axis I: Panic Disorder Axis II: Deferred Axis III:  Past Medical History  Diagnosis Date  . ADHD (attention deficit hyperactivity disorder)   . Anxiety   . Skin infection     Treated for past 6 weeks Dr Salome Holmes   Axis IV: other psychosocial or environmental problems and problems with access to health care services Axis V: 51-60 moderate symptoms  Past Medical History:  Past Medical History  Diagnosis Date  .  ADHD (attention deficit hyperactivity disorder)   . Anxiety   . Skin infection     Treated for past 6 weeks Dr Salome Holmes    No past surgical history on file.  Family History: No family history on file.  Social History:  reports that he has never smoked. He has never used smokeless tobacco. He reports that he drinks about .5 ounces of alcohol per week. He reports that he does not use illicit drugs.  Additional Social History:  Alcohol / Drug Use Pain Medications: not abusing Prescriptions: not abusing Over the Counter: not abusing History of alcohol / drug use?: No history of alcohol / drug abuse  CIWA: CIWA-Ar BP: 138/87 mmHg Pulse Rate: 89  COWS:    Allergies:  Allergies  Allergen Reactions  . Ciprocinonide (Fluocinolone)     extreme diaphorisis  . Doxycycline Rash    Home Medications:  (Not in a hospital admission)  OB/GYN Status:  No LMP for male patient.  General Assessment Data Location of Assessment: [facility] Living Arrangements: Non-relatives/Friends (5 college roomates) Can pt return to current living arrangement?: Yes Is patient capable of signing voluntary admission?: Yes Referral Source: Self/Family/Friend  Education Status Is patient currently in school?: Yes Current Grade: 16 Highest grade of school patient has completed: 15 Name of school: [employer]  Risk to self Suicidal Ideation: No (would not mind if he died only while having panic attack) Suicidal Intent: No Is patient at risk for suicide?: No Suicidal Plan?: No Access to Means: No What has been your use of drugs/alcohol within  the last 12 months?: none Previous Attempts/Gestures: No How many times?: 0  Other Self Harm Risks: panic attacks Intentional Self Injurious Behavior: None Family Suicide History: No (an Uncle died of SA issues and had bipolar/schiz DX) Recent stressful life event(s): Recent negative physical changes Persecutory voices/beliefs?: No Depression:  Yes Depression Symptoms: Insomnia;Tearfulness;Isolating;Feeling angry/irritable Substance abuse history and/or treatment for substance abuse?: No Suicide prevention information given to non-admitted patients: Yes  Risk to Others Homicidal Ideation: No Thoughts of Harm to Others: No Current Homicidal Intent: No Current Homicidal Plan: No Access to Homicidal Means: No Identified Victim: none History of harm to others?: No Assessment of Violence: None Noted Violent Behavior Description: yelling cursing, beating head with hands Does patient have access to weapons?: No Criminal Charges Pending?: No Does patient have a court date: No  Psychosis Hallucinations: Auditory (feels like people are speaking when he is listening to music) Delusions: None noted  Mental Status Report Appear/Hygiene: Other (Comment) (neat clean, appropriate for season) Eye Contact: Good Motor Activity: Unremarkable Speech: Logical/coherent Level of Consciousness: Alert Mood: Anxious;Guilty (guilt about frightening his mother) Affect: Anxious;Depressed Anxiety Level: Moderate Thought Processes: Coherent;Relevant Judgement: Unimpaired Orientation: Person;Place;Time;Situation Obsessive Compulsive Thoughts/Behaviors: None  Cognitive Functioning Concentration: Decreased Memory: Recent Intact;Remote Intact IQ: Average Insight: Fair Impulse Control: Good Appetite: Good (varies with his anxiety) Weight Loss:  (+5, -5 no overall change) Sleep: Decreased Total Hours of Sleep: 6  (3 hours at a time) Vegetative Symptoms: None  ADLScreening [facility] Services) Patient's cognitive ability adequate to safely complete daily activities?: Yes Patient able to express need for assistance with ADLs?: Yes Independently performs ADLs?: Yes (appropriate for developmental age)  Abuse/Neglect [facility]) Physical Abuse: Denies Verbal Abuse: Denies Sexual Abuse: Denies  Prior Inpatient Therapy Prior Inpatient  Therapy: No  Prior Outpatient Therapy Prior Outpatient Therapy: Yes Prior Therapy Dates: 6th grade Prior Therapy Facilty/Provider(s): nos (PCP currently) Reason for Treatment: anxiety/panic  ADL Screening (condition at time of admission) Patient's cognitive ability adequate to safely complete daily activities?: Yes Patient able to express need for assistance with ADLs?: Yes Independently performs ADLs?: Yes (appropriate for developmental age) Weakness of Legs: None Weakness of Arms/Hands: None  Home Assistive Devices/Equipment Home Assistive Devices/Equipment: None    Abuse/Neglect Assessment (Assessment to be complete while patient is alone) Physical Abuse: Denies Verbal Abuse: Denies Sexual Abuse: Denies Exploitation of patient/patient's resources: Denies Self-Neglect: Denies     Merchant [employer] officer (For Healthcare) Advance Directive: Patient does not have advance directive;Patient would not like information Pre-existing out of facility DNR order (yellow form or pink MOST form): No Nutrition Screen- [facility] Adult/[facility]/AP Patient's home diet: Regular Have you recently lost weight without trying?: No Have you been eating poorly because of a decreased appetite?: No Malnutrition Screening Tool Score: 0   Additional Information 1:1 In Past 12 Months?: No CIRT Risk: No Elopement Risk: No Does patient have medical clearance?: No     Disposition:  Disposition Disposition of Patient: Outpatient treatment Type of outpatient treatment: Psych Intensive Outpatient  On Site Evaluation by:   Reviewed with Physician:     Conan Bowens 02/04/2012 6:38 PM

## 2012-02-04 NOTE — Progress Notes (Addendum)
Patient ID: Timothy Vega, male   DOB: Jun 21, 1990, 22 y.o.   MRN: [id number] Review of Systems  Constitutional: Negative.   HENT: Negative.   Eyes: Negative.   Respiratory: Negative.   Cardiovascular: Negative.   Gastrointestinal: Positive for nausea, vomiting and diarrhea.  Genitourinary: Negative.   Musculoskeletal: Negative.   Skin: Positive for rash.  Neurological: Negative.   Endo/Heme/Allergies: Negative.   Psychiatric/Behavioral: Positive for depression. Negative for suicidal ideas. The patient is nervous/anxious and has insomnia.    Pt seen for medical screening. No active suicidal or homicidal thoughts or plan.  Patient has skin infection and taken multiple antibiotics. Loosing weight due to diarrhea. Schedule to see PCP Dr fry this week. Patient does not meet criteria for inpatient. He does not have an emergency medical condition. Recommended IOP, which he agreed. No other physical complaints.

## 2012-02-05 ENCOUNTER — Telehealth: Payer: Self-pay | Admitting: Family Medicine

## 2012-02-05 MED ORDER — VENLAFAXINE HCL ER 150 MG PO CP24: 150.0000 mg | ORAL_CAPSULE | Freq: Every day | ORAL | Status: DC

## 2012-02-05 NOTE — Telephone Encounter (Signed)
Patient's mom called stating that her son saw the group Therapist, [place] S[place] at [facility] and she states that Nic will not see the Psychiatrist until Feb 18th and asked her to call and request at least a one mth refill until the appointment. Please advise/assist. venlafaxine (EFFEXOR-XR) 150 MG 24 hr capsule [[id number] 1 capsule (150 mg total) by mouth daily.

## 2012-02-05 NOTE — Telephone Encounter (Signed)
I sent script e-scribe and spoke with pt. 

## 2012-02-05 NOTE — Telephone Encounter (Signed)
Call in #[phone]  

## 2012-02-05 NOTE — Telephone Encounter (Signed)
Patient's pharmacy is [facility]

## 2012-03-10 ENCOUNTER — Ambulatory Visit (INDEPENDENT_AMBULATORY_CARE_PROVIDER_SITE_OTHER): Payer: Managed Care, Other (non HMO) | Admitting: Family Medicine

## 2012-03-10 ENCOUNTER — Encounter: Payer: Self-pay | Admitting: Family Medicine

## 2012-03-10 VITALS — BP 118/70 | HR 61 | Temp 97.6°F | Wt 158.0 lb

## 2012-03-10 DIAGNOSIS — L700 Acne vulgaris: Secondary | ICD-10-CM | POA: Insufficient documentation

## 2012-03-10 DIAGNOSIS — L708 Other acne: Secondary | ICD-10-CM

## 2012-03-10 DIAGNOSIS — F411 Generalized anxiety disorder: Secondary | ICD-10-CM

## 2012-03-10 DIAGNOSIS — F909 Attention-deficit hyperactivity disorder, unspecified type: Secondary | ICD-10-CM

## 2012-03-10 MED ORDER — VENLAFAXINE HCL ER 150 MG PO CP24: 150.0000 mg | ORAL_CAPSULE | Freq: Every day | ORAL | Status: DC

## 2012-03-10 MED ORDER — ISOTRETINOIN 40 MG PO CAPS: 40.0000 mg | ORAL_CAPSULE | Freq: Every day | ORAL | Status: DC

## 2012-03-10 MED ORDER — AMPHETAMINE-DEXTROAMPHET ER 20 MG PO CP24: 20.0000 mg | ORAL_CAPSULE | ORAL | Status: DC

## 2012-03-10 NOTE — Progress Notes (Signed)
  Subjective:    Patient ID: Timothy Vega, male    DOB: February 28, 1990, 22 y.o.   MRN: [id number]  HPI Here to follow up. His ADHD is stable on Adderall. He had stopped Effexor for awhile but found that his anxiety flared up again, so he has started back on this again. Now the anxiety is well controlled. He has been on Accutane for one month and is doing well.    Review of Systems  Constitutional: Negative.   Neurological: Negative.   Psychiatric/Behavioral: Negative.        Objective:   Physical Exam  Constitutional: He is oriented to person, place, and time. He appears well-developed and well-nourished.  Neurological: He is alert and oriented to person, place, and time.  Skin:  Cystic acne with bright erythema over the face   Psychiatric: He has a normal mood and affect. His behavior is normal. Thought content normal.          Assessment & Plan:  He is doing well. Meds were refilled

## 2012-05-21 ENCOUNTER — Encounter: Payer: Self-pay | Admitting: Family Medicine

## 2012-05-21 ENCOUNTER — Ambulatory Visit (INDEPENDENT_AMBULATORY_CARE_PROVIDER_SITE_OTHER): Payer: Managed Care, Other (non HMO) | Admitting: Family Medicine

## 2012-05-21 VITALS — BP 104/72 | Temp 98.4°F | Wt 161.0 lb

## 2012-05-21 DIAGNOSIS — J309 Allergic rhinitis, unspecified: Secondary | ICD-10-CM

## 2012-05-21 MED ORDER — MOMETASONE FUROATE 50 MCG/ACT NA SUSP: 2.0000 | Freq: Every day | NASAL | Status: DC

## 2012-05-21 NOTE — Progress Notes (Signed)
Chief Complaint  Patient presents with  . URI    cough, congestion(head and chest)     HPI:  Acute visit for URI: -started: 3 days -symptoms:nasal congestion, sore throat, cough, sneezing, eyes itchy -denies:fever, SOB, NVD, tooth pain, ear pain -has tried: has tried OTC allergy medications -sick contacts: none known -Hx of: allergies, never tested -denies listed allergy to steroids - has used steroid creams for skin and has been fine with them   ROS: See pertinent positives and negatives per HPI.  Past Medical History  Diagnosis Date  . ADHD (attention deficit hyperactivity disorder)   . Anxiety   . Acne vulgaris     sees Dr. Campbell Stall     No family history on file.  History   Social History  . Marital Status: Single    Spouse Name: N/A    Number of Children: N/A  . Years of Education: N/A   Social History Main Topics  . Smoking status: Never Smoker   . Smokeless tobacco: Never Used  . Alcohol Use: 0.5 oz/week    1 drink(s) per week     Comment: rarely  . Drug Use: No  . Sexually Active: Not Currently   Other Topics Concern  . None   Social History Narrative  . None    Current outpatient prescriptions:amphetamine-dextroamphetamine (ADDERALL XR) 20 MG 24 hr capsule, Take 1 capsule (20 mg total) by mouth every morning., Disp: 30 capsule, Rfl: 0;  ISOtretinoin (ACCUTANE) 40 MG capsule, Take 1 capsule (40 mg total) by mouth daily., Disp: 30 capsule, Rfl: 0;  venlafaxine XR (EFFEXOR-XR) 150 MG 24 hr capsule, Take 1 capsule (150 mg total) by mouth daily., Disp: 30 capsule, Rfl: 11 mometasone (NASONEX) 50 MCG/ACT nasal spray, Place 2 sprays into the nose daily., Disp: 17 g, Rfl: 12  EXAM:  Filed Vitals:   05/21/12 1542  BP: 104/72  Temp: 98.4 F (36.9 C)    There is no height on file to calculate BMI.  GENERAL: vitals reviewed and listed above, alert, oriented, appears well hydrated and in no acute distress  HEENT: atraumatic, conjunttiva mildly  irritated, allergic shiners, no obvious abnormalities on inspection of external nose and ears, normal appearance of ear canals and TMs, clear nasal congestion pale boggy trubinates, mild post oropharyngeal erythema with PND, cobblestoning, no tonsillar edema or exudate, no sinus TTP  NECK: no obvious masses on inspection  LUNGS: clear to auscultation bilaterally, no wheezes, rales or rhonchi, good air movement  CV: HRRR, no peripheral edema  MS: moves all extremities without noticeable abnormality  PSYCH: pleasant and cooperative, no obvious depression or anxiety  ASSESSMENT AND PLAN:  Discussed the following assessment and plan:  Allergic rhinitis - Plan: mometasone (NASONEX) 50 MCG/ACT nasal spray  -start zyrtec and nasal spray, afrin short term then stop to prevent rebound, return precautions -follow up in 1 month -Patient advised to return or notify a doctor immediately if symptoms worsen or persist or new concerns arise.  Patient Instructions  Start zyrtec every night  Start nasonex daily  Use Afrin as instructed for 4 days then STOP  Follow up in 1 month or sooner if concerns     Bennye Nix R.

## 2012-05-21 NOTE — Patient Instructions (Addendum)
Start zyrtec every night  Start nasonex daily  Use Afrin as instructed for 4 days then STOP  Follow up in 1 month or sooner if concerns

## 2012-07-24 ENCOUNTER — Telehealth: Payer: Self-pay | Admitting: Family Medicine

## 2012-07-24 MED ORDER — AMPHETAMINE-DEXTROAMPHET ER 20 MG PO CP24: 20.0000 mg | ORAL_CAPSULE | ORAL | Status: DC

## 2012-07-24 NOTE — Telephone Encounter (Signed)
Pt needs new rx generic adderall xr 20 mg. Pt is out. Pt mother is aware MD out of office. Pt needs new rx today

## 2012-07-24 NOTE — Telephone Encounter (Signed)
OK  Rx  30 #  Further  Refills from pcp

## 2012-07-24 NOTE — Telephone Encounter (Signed)
Script is ready for pick up and I left a voice message for pt. 

## 2012-07-27 ENCOUNTER — Emergency Department (HOSPITAL_COMMUNITY)
Admission: EM | Admit: 2012-07-27 | Discharge: 2012-07-27 | Disposition: A | Discharge status: Home / Self Care | Payer: Managed Care, Other (non HMO) | Attending: Emergency Medicine | Admitting: Emergency Medicine

## 2012-07-27 ENCOUNTER — Emergency Department (HOSPITAL_COMMUNITY): Payer: Managed Care, Other (non HMO)

## 2012-07-27 ENCOUNTER — Encounter (HOSPITAL_COMMUNITY): Payer: Self-pay | Admitting: *Deleted

## 2012-07-27 DIAGNOSIS — IMO0002 Reserved for concepts with insufficient information to code with codable children: Secondary | ICD-10-CM | POA: Insufficient documentation

## 2012-07-27 DIAGNOSIS — Y92410 Unspecified street and highway as the place of occurrence of the external cause: Secondary | ICD-10-CM | POA: Insufficient documentation

## 2012-07-27 DIAGNOSIS — Z872 Personal history of diseases of the skin and subcutaneous tissue: Secondary | ICD-10-CM | POA: Insufficient documentation

## 2012-07-27 DIAGNOSIS — F909 Attention-deficit hyperactivity disorder, unspecified type: Secondary | ICD-10-CM | POA: Insufficient documentation

## 2012-07-27 DIAGNOSIS — Z79899 Other long term (current) drug therapy: Secondary | ICD-10-CM | POA: Insufficient documentation

## 2012-07-27 DIAGNOSIS — F411 Generalized anxiety disorder: Secondary | ICD-10-CM | POA: Insufficient documentation

## 2012-07-27 DIAGNOSIS — S6990XA Unspecified injury of unspecified wrist, hand and finger(s), initial encounter: Secondary | ICD-10-CM | POA: Insufficient documentation

## 2012-07-27 DIAGNOSIS — S6390XA Sprain of unspecified part of unspecified wrist and hand, initial encounter: Secondary | ICD-10-CM | POA: Insufficient documentation

## 2012-07-27 DIAGNOSIS — Y9389 Activity, other specified: Secondary | ICD-10-CM | POA: Insufficient documentation

## 2012-07-27 DIAGNOSIS — S59909A Unspecified injury of unspecified elbow, initial encounter: Secondary | ICD-10-CM | POA: Insufficient documentation

## 2012-07-27 DIAGNOSIS — S6392XA Sprain of unspecified part of left wrist and hand, initial encounter: Secondary | ICD-10-CM

## 2012-07-27 MED ORDER — NAPROXEN 375 MG PO TABS: 375.0000 mg | ORAL_TABLET | Freq: Two times a day (BID) | ORAL | Status: DC

## 2012-07-27 NOTE — ED Notes (Signed)
Ortho tech made aware of splint 

## 2012-07-27 NOTE — ED Notes (Signed)
Ice pack given
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
## 2012-07-28 NOTE — ED Provider Notes (Signed)
Medical screening examination/treatment/procedure(s) were conducted as a shared visit with non-physician practitioner(s) or resident  and myself.  I personally evaluated the patient during the encounter and agree with the findings and plan unless otherwise indicated.   Enid Skeens, MD 07/28/12 [phone]

## 2012-08-03 ENCOUNTER — Encounter: Payer: Self-pay | Admitting: Family Medicine

## 2012-08-03 ENCOUNTER — Ambulatory Visit (INDEPENDENT_AMBULATORY_CARE_PROVIDER_SITE_OTHER): Payer: Managed Care, Other (non HMO) | Admitting: Family Medicine

## 2012-08-03 DIAGNOSIS — Z5189 Encounter for other specified aftercare: Secondary | ICD-10-CM
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
## 2012-08-31 ENCOUNTER — Ambulatory Visit (INDEPENDENT_AMBULATORY_CARE_PROVIDER_SITE_OTHER): Payer: Managed Care, Other (non HMO) | Admitting: Family Medicine

## 2012-08-31 ENCOUNTER — Encounter: Payer: Self-pay | Admitting: Family Medicine

## 2012-08-31 DIAGNOSIS — Z5189 Encounter for other specified aftercare: Secondary | ICD-10-CM
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
## 2012-10-09 ENCOUNTER — Ambulatory Visit (INDEPENDENT_AMBULATORY_CARE_PROVIDER_SITE_OTHER): Payer: Managed Care, Other (non HMO)

## 2012-10-09 DIAGNOSIS — Z23 Encounter for immunization: Secondary | ICD-10-CM

## 2013-02-05 ENCOUNTER — Ambulatory Visit: Payer: Self-pay | Admitting: Family Medicine

## 2013-04-02 ENCOUNTER — Other Ambulatory Visit: Payer: Self-pay | Admitting: Family Medicine

## 2013-05-28 ENCOUNTER — Encounter: Payer: Self-pay | Admitting: Family Medicine

## 2013-05-28 ENCOUNTER — Ambulatory Visit (INDEPENDENT_AMBULATORY_CARE_PROVIDER_SITE_OTHER): Payer: Managed Care, Other (non HMO) | Admitting: Family Medicine

## 2013-05-28 VITALS — BP 139/90 | HR 70 | Temp 97.8°F | Ht 67.0 in | Wt 158.0 lb

## 2013-05-28 DIAGNOSIS — F909 Attention-deficit hyperactivity disorder, unspecified type: Secondary | ICD-10-CM

## 2013-05-28 DIAGNOSIS — F411 Generalized anxiety disorder: Secondary | ICD-10-CM

## 2013-05-28 MED ORDER — VENLAFAXINE HCL ER 75 MG PO CP24: 75.0000 mg | ORAL_CAPSULE | Freq: Every day | ORAL | Status: DC

## 2013-05-28 MED ORDER — VENLAFAXINE HCL ER 150 MG PO CP24: 150.0000 mg | ORAL_CAPSULE | Freq: Every day | ORAL | Status: DC

## 2013-05-28 NOTE — Progress Notes (Signed)
Pre visit review using our clinic review tool, if applicable. No additional management support is needed unless otherwise documented below in the visit note. 

## 2013-05-28 NOTE — Progress Notes (Signed)
   Subjective:    Patient ID: Timothy Vega, male    DOB: 06/11/1990, 23 y.o.   MRN: [id number]  HPI Here to discuss his anxiety. He has been under more stress at work and he thinks his Effexor should be increased a bit. He sleeps well. He is not using Adderall right now since he is not taking classes.    Review of Systems  Constitutional: Negative.   Neurological: Negative.   Psychiatric/Behavioral: Negative.        Objective:   Physical Exam  Constitutional: He is oriented to person, place, and time. He appears well-developed and well-nourished.  Cardiovascular: Normal rate, regular rhythm, normal heart sounds and intact distal pulses.   Pulmonary/Chest: Effort normal and breath sounds normal.  Neurological: He is alert and oriented to person, place, and time.  Psychiatric: He has a normal mood and affect. His behavior is normal. Thought content normal.          Assessment & Plan:  We will increase the Effexor to a total of 225 mg daily.

## 2013-09-29 ENCOUNTER — Encounter: Payer: Self-pay | Admitting: Family Medicine

## 2013-09-29 ENCOUNTER — Ambulatory Visit (INDEPENDENT_AMBULATORY_CARE_PROVIDER_SITE_OTHER): Payer: Managed Care, Other (non HMO) | Admitting: Family Medicine

## 2013-09-29 VITALS — BP 124/76 | HR 59 | Temp 98.2°F | Ht 67.0 in | Wt 165.0 lb

## 2013-09-29 DIAGNOSIS — J209 Acute bronchitis, unspecified: Secondary | ICD-10-CM

## 2013-09-29 MED ORDER — AZITHROMYCIN 250 MG PO TABS: ORAL_TABLET | ORAL | Status: DC

## 2013-09-29 NOTE — Progress Notes (Signed)
   Subjective:    Patient ID: Timothy Vega, male    DOB: 10-02-1990, 23 y.o.   MRN: [id number]  HPI Here for 3 weeks of chest congestion and a dry cough. No fever. No ST or sinus symptoms.    Review of Systems  Constitutional: Negative.   HENT: Negative.   Eyes: Negative.   Respiratory: Positive for cough.        Objective:   Physical Exam  Constitutional: He appears well-developed and well-nourished.  HENT:  Right Ear: External ear normal.  Left Ear: External ear normal.  Nose: Nose normal.  Mouth/Throat: Oropharynx is clear and moist.  Eyes: Conjunctivae are normal.  Pulmonary/Chest: Effort normal and breath sounds normal.  Lymphadenopathy:    He has no cervical adenopathy.          Assessment & Plan:  Bronchitis, possibly atypical. Treat with a Zpack

## 2013-09-29 NOTE — Progress Notes (Signed)
Pre visit review using our clinic review tool, if applicable. No additional management support is needed unless otherwise documented below in the visit note. 

## 2013-10-29 ENCOUNTER — Ambulatory Visit: Payer: Self-pay | Admitting: Family Medicine

## 2014-06-20 ENCOUNTER — Other Ambulatory Visit: Payer: Self-pay | Admitting: Family Medicine

## 2014-09-16 ENCOUNTER — Ambulatory Visit (INDEPENDENT_AMBULATORY_CARE_PROVIDER_SITE_OTHER): Payer: Managed Care, Other (non HMO)

## 2014-09-16 DIAGNOSIS — Z23 Encounter for immunization: Secondary | ICD-10-CM

## 2015-03-02 ENCOUNTER — Encounter: Payer: Self-pay | Admitting: Family Medicine

## 2015-03-02 ENCOUNTER — Ambulatory Visit (INDEPENDENT_AMBULATORY_CARE_PROVIDER_SITE_OTHER): Payer: 59 | Admitting: Family Medicine

## 2015-03-02 VITALS — BP 130/81 | HR 74 | Temp 99.0°F | Ht 67.0 in | Wt 165.0 lb

## 2015-03-02 DIAGNOSIS — J019 Acute sinusitis, unspecified: Secondary | ICD-10-CM | POA: Diagnosis not present

## 2015-03-02 MED ORDER — AZITHROMYCIN 250 MG PO TABS: ORAL_TABLET | ORAL | Status: DC

## 2015-03-02 NOTE — Progress Notes (Signed)
   Subjective:    Patient ID: Timothy Vega, male    DOB: Jan 04, 1991, 25 y.o.   MRN: [id number]  HPI Here for 3 days of sinus pressure, HA, PND, and ST. No cough.    Review of Systems  Constitutional: Positive for fever.  HENT: Positive for congestion, postnasal drip, sinus pressure and sore throat. Negative for ear pain.   Eyes: Negative.   Respiratory: Negative.        Objective:   Physical Exam  Constitutional: He appears well-developed and well-nourished.  HENT:  Right Ear: External ear normal.  Left Ear: External ear normal.  Nose: Nose normal.  Mouth/Throat: Oropharynx is clear and moist.  Eyes: Conjunctivae are normal.  Neck: No thyromegaly present.  Cardiovascular: Normal rate, regular rhythm, normal heart sounds and intact distal pulses.   Pulmonary/Chest: Effort normal and breath sounds normal.  Lymphadenopathy:    He has no cervical adenopathy.          Assessment & Plan:  Sinusitis, treat with a Zpack

## 2015-03-02 NOTE — Progress Notes (Signed)
Pre visit review using our clinic review tool, if applicable. No additional management support is needed unless otherwise documented below in the visit note. 

## 2015-07-10 ENCOUNTER — Other Ambulatory Visit: Payer: Self-pay | Admitting: Family Medicine

## 2016-08-05 ENCOUNTER — Other Ambulatory Visit: Payer: Self-pay | Admitting: Family Medicine

## 2016-09-09 ENCOUNTER — Encounter: Payer: Self-pay | Admitting: Family Medicine

## 2016-09-09 ENCOUNTER — Ambulatory Visit (INDEPENDENT_AMBULATORY_CARE_PROVIDER_SITE_OTHER): Payer: 59 | Admitting: Family Medicine

## 2016-09-09 VITALS — BP 150/90 | HR 70 | Temp 98.0°F | Ht 67.0 in | Wt 165.0 lb

## 2016-09-09 DIAGNOSIS — F900 Attention-deficit hyperactivity disorder, predominantly inattentive type: Secondary | ICD-10-CM | POA: Diagnosis not present

## 2016-09-09 DIAGNOSIS — F411 Generalized anxiety disorder: Secondary | ICD-10-CM | POA: Diagnosis not present

## 2016-09-09 MED ORDER — VENLAFAXINE HCL ER 75 MG PO CP24: ORAL_CAPSULE | ORAL | 3 refills | Status: DC

## 2016-09-09 MED ORDER — VENLAFAXINE HCL ER 150 MG PO CP24: ORAL_CAPSULE | ORAL | 3 refills | Status: DC

## 2016-09-09 NOTE — Progress Notes (Signed)
   Subjective:    Patient ID: Timothy Vega, male    DOB: [birth year], 26 y.o.   MRN: [id number]  HPI Here to follow up. He is doing well and has no concerns. His medication helps a lot and his moods are stable. He is working full time.    Review of Systems  Constitutional: Negative.   Respiratory: Negative.   Cardiovascular: Negative.   Neurological: Negative.   Psychiatric/Behavioral: Negative.        Objective:   Physical Exam  Constitutional: He is oriented to person, place, and time. He appears well-developed and well-nourished.  HENT:  Right Ear: External ear normal.  Left Ear: External ear normal.  Nose: Nose normal.  Mouth/Throat: Oropharynx is clear and moist.  Eyes: Conjunctivae are normal.  Neck: No thyromegaly present.  Cardiovascular: Normal rate, regular rhythm, normal heart sounds and intact distal pulses.   Pulmonary/Chest: Effort normal and breath sounds normal. No respiratory distress. He has no wheezes.  Lymphadenopathy:    He has no cervical adenopathy.  Neurological: He is alert and oriented to person, place, and time.  Psychiatric: He has a normal mood and affect. His behavior is normal. Thought content normal.          Assessment & Plan:  His anxiety and ADHD are stable. Meds were refilled.  Gershon Crane, MD

## 2016-09-09 NOTE — Patient Instructions (Signed)
WE NOW OFFER   Timothy Vega's FAST TRACK!!!  SAME DAY Appointments for ACUTE CARE  Such as: Sprains, Injuries, cuts, abrasions, rashes, muscle pain, joint pain, back pain Colds, flu, sore throats, headache, allergies, cough, fever  Ear pain, sinus and eye infections Abdominal pain, nausea, vomiting, diarrhea, upset stomach Animal/insect bites  3 Easy Ways to Schedule: Walk-In Scheduling Call in scheduling Mychart Sign-up: [email or web address]         

## 2017-08-12 ENCOUNTER — Ambulatory Visit (INDEPENDENT_AMBULATORY_CARE_PROVIDER_SITE_OTHER): Payer: 59 | Admitting: Family Medicine

## 2017-08-12 ENCOUNTER — Ambulatory Visit (INDEPENDENT_AMBULATORY_CARE_PROVIDER_SITE_OTHER): Payer: 59

## 2017-08-12 ENCOUNTER — Encounter: Payer: Self-pay | Admitting: Family Medicine

## 2017-08-12 VITALS — BP 140/100 | HR 64 | Temp 98.3°F | Ht 67.0 in | Wt 164.3 lb

## 2017-08-12 DIAGNOSIS — S40011A Contusion of right shoulder, initial encounter: Secondary | ICD-10-CM

## 2017-08-12 DIAGNOSIS — M25511 Pain in right shoulder: Secondary | ICD-10-CM | POA: Diagnosis not present

## 2017-08-12 NOTE — Progress Notes (Signed)
   Subjective:    Patient ID: Timothy Vega, male    DOB: 03/23/1990, 27 y.o.   MRN: [id number]  HPI Her to check on an injury to the right shoulder that occurred on 08-10-17. While riding his skateboard at a local skateboard park he fell, landing directly on the shoulder. He has been applying ice, wearing a sling, and taking Ibuprofen. He has some pain but fairly good ROM.    Review of Systems  Constitutional: Negative.   Respiratory: Negative.   Cardiovascular: Negative.   Musculoskeletal: Positive for arthralgias.  Neurological: Negative.        Objective:   Physical Exam  Constitutional: He is oriented to person, place, and time. He appears well-developed and well-nourished. No distress.  Cardiovascular: Normal rate, regular rhythm, normal heart sounds and intact distal pulses.  Pulmonary/Chest: Effort normal and breath sounds normal.  Musculoskeletal:  The right shoulder shows no swelling. There is an abrasion covered by a scab on the superior part of the shoulder. This are is moderately tender. ROM is intact. No crepitus.   Neurological: He is alert and oriented to person, place, and time.  My review of his Xrays reveal no fractures or separations.         Assessment & Plan:  Shoulder contusion. This should resolve over the next 7-10 days. Wear the sling prn. Take Ibuprofen prn.  Gershon CraneStephen Fry, MD

## 2017-10-14 ENCOUNTER — Other Ambulatory Visit: Payer: Self-pay | Admitting: Family Medicine

## 2017-11-22 ENCOUNTER — Other Ambulatory Visit: Payer: Self-pay | Admitting: Family Medicine

## 2017-11-26 ENCOUNTER — Other Ambulatory Visit: Payer: Self-pay | Admitting: Family Medicine

## 2017-11-26 NOTE — Telephone Encounter (Signed)
Requested medication (s) are due for refill today: yes  Requested medication (s) are on the active medication list: yes  Last refill:  10/15/17  Future visit scheduled: no- left message for pt to call back and make appointment  Notes to clinic:  BP 140/100 08/12/17   Requested Prescriptions  Pending Prescriptions Disp Refills   venlafaxine XR (EFFEXOR-XR) 150 MG 24 hr capsule 30 capsule 0     Psychiatry: Antidepressants - SNRI - desvenlafaxine & venlafaxine Failed - 11/26/2017  2:59 PM      Failed - LDL in normal range and within 360 days    No results found for: LDLCALC, LDLC, HIRISKLDL       Failed - Total Cholesterol in normal range and within 360 days    No results found for: CHOL, POCCHOL       Failed - Triglycerides in normal range and within 360 days    No results found for: TRIG       Failed - Last BP in normal range    BP Readings from Last 1 Encounters:  08/12/17 (!) 140/100         Failed - Valid encounter within last 6 months    Recent Outpatient Visits          3 months ago Contusion of right shoulder, initial [facility] Tera MaterStephen A, [facility]   1 year ago Anxiety Advertising account plannerstate   [facility] Tera M[facility]Stephen A, [facility]   2 years ago Acute sinusitis, recurrence not specified, unspecified location   [facility] Tera MaterStephen A, MD   4 years ago Acute bronchitis, unspecified organism   [facility] Tera MaterStephen A, MD   4 years ago Media plannerANXIETY   [facility] Tera MaterStephen A, MD

## 2017-11-26 NOTE — Telephone Encounter (Signed)
Copied from CRM [phone]. Topic: General - Other >> Nov 26, 2017  2:48 PM Ronney Lionrrington, Shykila A wrote: Medication: venlafaxine XR (EFFEXOR-XR) 150 MG 24 hr capsule   Has the patient contacted their pharmacy? Yes  Preferred Pharmacy (with phone number or street name): [facility] [place] [address]  [phone] (Phone) [phone] (Fax)    Agent: Please be advised that RX refills may take up to 3 business days. We ask that you follow-up with your pharmacy.

## 2017-11-26 NOTE — Telephone Encounter (Signed)
Called pt and left message to make an appointment

## 2017-11-27 NOTE — Telephone Encounter (Signed)
Patient has two different doses of this medication with the same sig. Both last filled on 10/15/17.   Ok to fill?

## 2017-11-28 ENCOUNTER — Encounter: Payer: Self-pay | Admitting: Family Medicine

## 2017-11-28 ENCOUNTER — Ambulatory Visit (INDEPENDENT_AMBULATORY_CARE_PROVIDER_SITE_OTHER): Payer: 59 | Admitting: Family Medicine

## 2017-11-28 VITALS — BP 100/80 | HR 69 | Temp 97.4°F | Ht 67.0 in | Wt 169.4 lb

## 2017-11-28 DIAGNOSIS — F900 Attention-deficit hyperactivity disorder, predominantly inattentive type: Secondary | ICD-10-CM | POA: Diagnosis not present

## 2017-11-28 MED ORDER — VENLAFAXINE HCL ER 75 MG PO CP24: 75.0000 mg | ORAL_CAPSULE | Freq: Every day | ORAL | 0 refills | Status: DC

## 2017-11-28 MED ORDER — VENLAFAXINE HCL ER 150 MG PO CP24: 150.0000 mg | ORAL_CAPSULE | Freq: Every day | ORAL | 0 refills | Status: DC

## 2017-11-28 NOTE — Progress Notes (Signed)
   Subjective:    Patient ID: Timothy Vega, male    DOB: 04/09/1990, 27 y.o.   MRN: [id number]  HPI Here to follow up on ADHD. He is doing well. The medication works well with no side effects.    Review of Systems  Constitutional: Negative.   Respiratory: Negative.   Cardiovascular: Negative.   Neurological: Negative.   Psychiatric/Behavioral: Negative.        Objective:   Physical Exam  Constitutional: He is oriented to person, place, and time. He appears well-developed and well-nourished.  Cardiovascular: Normal rate, regular rhythm, normal heart sounds and intact distal pulses.  Pulmonary/Chest: Effort normal and breath sounds normal.  Neurological: He is alert and oriented to person, place, and time.          Assessment & Plan:  ADHD, stable. meds were refilled.  Gershon CraneStephen Fry, MD

## 2018-06-03 ENCOUNTER — Ambulatory Visit (INDEPENDENT_AMBULATORY_CARE_PROVIDER_SITE_OTHER): Payer: BLUE CROSS/BLUE SHIELD | Admitting: Family Medicine

## 2018-06-03 ENCOUNTER — Ambulatory Visit: Payer: 59 | Admitting: Family Medicine

## 2018-06-03 ENCOUNTER — Ambulatory Visit (HOSPITAL_COMMUNITY)
Admission: RE | Admit: 2018-06-03 | Discharge: 2018-06-03 | Disposition: A | Discharge status: Home / Self Care | Payer: BLUE CROSS/BLUE SHIELD | Source: Ambulatory Visit | Attending: Family Medicine | Admitting: Family Medicine

## 2018-06-03 ENCOUNTER — Other Ambulatory Visit: Payer: Self-pay

## 2018-06-03 ENCOUNTER — Telehealth: Payer: Self-pay | Admitting: Family Medicine

## 2018-06-03 ENCOUNTER — Encounter: Payer: Self-pay | Admitting: Family Medicine

## 2018-06-03 VITALS — BP 118/76 | HR 88 | Temp 98.4°F | Ht 67.0 in | Wt 169.5 lb

## 2018-06-03 DIAGNOSIS — N433 Hydrocele, unspecified: Secondary | ICD-10-CM | POA: Diagnosis not present

## 2018-06-03 DIAGNOSIS — N50811 Right testicular pain: Secondary | ICD-10-CM | POA: Diagnosis not present

## 2018-06-03 DIAGNOSIS — R319 Hematuria, unspecified: Secondary | ICD-10-CM | POA: Diagnosis not present

## 2018-06-03 LAB — POC URINALSYSI DIPSTICK (AUTOMATED)
Bilirubin, UA: NEGATIVE
Blood, UA: POSITIVE
Glucose, UA: NEGATIVE
Ketones, UA: NEGATIVE
Nitrite, UA: NEGATIVE
Protein, UA: POSITIVE — AB
Spec Grav, UA: 1.025 (ref 1.010–1.025)
Urobilinogen, UA: 0.2 E.U./dL
pH, UA: 6 (ref 5.0–8.0)

## 2018-06-03 MED ORDER — CIPROFLOXACIN HCL 500 MG PO TABS: 500.0000 mg | ORAL_TABLET | Freq: Two times a day (BID) | ORAL | 0 refills | Status: DC

## 2018-06-03 NOTE — Telephone Encounter (Signed)
The Cipro- fluocinonide is apparently topical otic drop.  Doubt high risk, but let's start Cephalexin 500 mg po tid for 5 days pending urine cx.

## 2018-06-03 NOTE — Telephone Encounter (Signed)
Spoke with pharmacist and informed her the only allergies we have are Doxyxyline and Ciprocinonide. Pharmacist verbalized she would like doctor to make ultimate decision on as to if patient should take medication. Dr. Caryl Never I routed to you since you seen and rx'd this medication

## 2018-06-03 NOTE — Telephone Encounter (Signed)
Copied from CRM [id number]. Topic: Quick Communication - See Telephone Encounter >> Jun 03, 2018  9:25 AM Angela Nevin wrote: CRM for notification. See Telephone encounter for: 06/03/18.  [place], Pharmacist with [facility], calling to get clarification on the medication ciprofloxacin (CIPRO) 500 MG tablet as they have on file that patient has had reaction in the past.

## 2018-06-03 NOTE — Progress Notes (Signed)
  Subjective:     Patient ID: Timothy Vega, male   DOB: 11-21-1990, 28 y.o.   MRN: [id number]  HPI  Patient is seen with acute pain right testicle.  He states he woke up Sunday morning with some pain and mild swelling of the right testicle region.  No fever.  2 weeks ago he had some mild burning with urination but those symptoms seemed to clear.  He has had some occasional mild penile discharge.  He is monogamous.  No history of STD.  His pain was worse early in the week and is actually slightly improved today.  He denies any recent abdominal pain or flank pain.  He has history of allergy with rash to doxycycline  Past Medical History:  Diagnosis Date  . Acne vulgaris    sees Dr. Campbell Stall   . ADHD (attention deficit hyperactivity disorder)   . Anxiety    History reviewed. No pertinent surgical history.  reports that he has never smoked. He has never used smokeless tobacco. He reports current alcohol use. He reports that he does not use drugs. family history is not on file. Allergies  Allergen Reactions  . Ciprocinonide [Fluocinolone]     extreme diaphorisis  . Doxycycline Rash    Review of Systems  Constitutional: Negative for chills and fever.  Gastrointestinal: Negative for abdominal pain.  Genitourinary: Negative for flank pain and hematuria.  Musculoskeletal: Negative for back pain.  Neurological: Negative for dizziness.  Hematological: Negative for adenopathy.       Objective:   Physical Exam Constitutional:      Appearance: Normal appearance.  Cardiovascular:     Rate and Rhythm: Normal rate.  Genitourinary:    Comments: Left testicle is normal in size and nontender.  No masses.  Right testicle is slightly enlarged.  has rounded cystic type swelling along border of the testes.  Is slightly tender. Neurological:     Mental Status: He is alert.        Assessment:     Acute right testicle pain.  Doubt torsion. ?epidydymitis.    Plan:     -Set up  ultrasound scrotum with Doppler and will try to get this today -Obtain urine dipstick.  This does show some leukocytes and blood of uncertain significance.  Culture will be obtained -We will go ahead and cover with antibiotics pending further studies.  He has allergy to doxycycline.  Would normally use doxycycline in this setting- but doxy allergy and monogamous and low risk for STD. Given above, consider quinolone  Kristian Covey MD [facility]

## 2018-06-03 NOTE — Patient Instructions (Signed)
Go ahead and start the Cipro  We will call you with culture results  Go for ultrasound as instructed.

## 2018-06-04 NOTE — Telephone Encounter (Signed)
Angie called from [facility] and was informed to change the Rx to Cephalexin as ordered below by Dr Caryl Never.

## 2018-06-05 LAB — URINE CULTURE
MICRO NUMBER:: 492009
Result:: NO GROWTH
SPECIMEN QUALITY:: ADEQUATE

## 2018-07-16 ENCOUNTER — Other Ambulatory Visit: Payer: Self-pay | Admitting: Family Medicine

## 2018-07-21 NOTE — Telephone Encounter (Signed)
Dr. Sarajane Jews please advise on refill of these meds.  Thanks

## 2019-01-05 ENCOUNTER — Ambulatory Visit: Payer: BC Managed Care – PPO | Attending: Internal Medicine

## 2019-01-05 DIAGNOSIS — Z20828 Contact with and (suspected) exposure to other viral communicable diseases: Secondary | ICD-10-CM | POA: Diagnosis not present

## 2019-01-05 DIAGNOSIS — Z20822 Contact with and (suspected) exposure to covid-19: Secondary | ICD-10-CM

## 2019-01-06 LAB — NOVEL CORONAVIRUS, NAA: SARS-CoV-2, NAA: NOT DETECTED

## 2019-02-10 ENCOUNTER — Other Ambulatory Visit: Payer: Self-pay | Admitting: Family Medicine

## 2019-02-15 ENCOUNTER — Telehealth (INDEPENDENT_AMBULATORY_CARE_PROVIDER_SITE_OTHER): Payer: Managed Care, Other (non HMO) | Admitting: Family Medicine

## 2019-02-15 ENCOUNTER — Other Ambulatory Visit: Payer: Self-pay

## 2019-02-15 DIAGNOSIS — F411 Generalized anxiety disorder: Secondary | ICD-10-CM | POA: Diagnosis not present

## 2019-02-15 MED ORDER — VENLAFAXINE HCL ER 75 MG PO CP24: 75.0000 mg | ORAL_CAPSULE | Freq: Every day | ORAL | 3 refills | Status: DC

## 2019-02-15 MED ORDER — VENLAFAXINE HCL ER 150 MG PO CP24: 150.0000 mg | ORAL_CAPSULE | Freq: Every day | ORAL | 3 refills | Status: DC

## 2019-02-15 NOTE — Progress Notes (Signed)
Virtual Visit via Telephone Note  I connected with the patient on 02/15/19 at  1:45 PM EST by telephone and verified that I am speaking with the correct person using two identifiers.   I discussed the limitations, risks, security and privacy concerns of performing an evaluation and management service by telephone and the availability of in person appointments. I also discussed with the patient that there may be a patient responsible charge related to this service. The patient expressed understanding and agreed to proceed.  Location patient: home Location provider: work or home office Participants present for the call: patient, provider Patient did not have a visit in the prior 7 days to address this/these issue(s).   History of Present Illness: Here to get refills on Effexor XR. He takes this for his anxiety,and it has been working very well for him. He has no complaints.    Observations/Objective: Patient sounds cheerful and well on the phone. I do not appreciate any SOB. Speech and thought processing are grossly intact. Patient reported vitals:  Assessment and Plan: Anxiety, well controlled. Refilled Effexor XR as before.  Gershon Crane, MD   Follow Up Instructions:     [phone] 5-10 [phone] 11-20 [id number] 21-30 I did not refer this patient for an OV in the next 24 hours for this/these issue(s).  I discussed the assessment and treatment plan with the patient. The patient was provided an opportunity to ask questions and all were answered. The patient agreed with the plan and demonstrated an understanding of the instructions.   The patient was advised to call back or seek an in-person evaluation if the symptoms worsen or if the condition fails to improve as anticipated.  I provided 10 minutes of non-face-to-face time during this encounter.   Gershon Crane, MD

## 2020-01-25 IMAGING — US ULTRASOUND SCROTUM DOPPLER COMPLETE
1 series · 14 of 25 positions shown · non-contrast
Comparison: None.

CLINICAL DATA: Acute right testicular pain and swelling for 3 days

EXAM:
SCROTAL ULTRASOUND
DOPPLER ULTRASOUND OF THE TESTICLES
TECHNIQUE: Complete ultrasound examination of the testicles, epididymis, and
other scrotal structures was performed. Color and spectral Doppler
ultrasound were also utilized to evaluate blood flow to the
testicles.

[Series 1: ultrasound scrotum doppler complete · 14 of 73 slices shown]
[im 1/73]
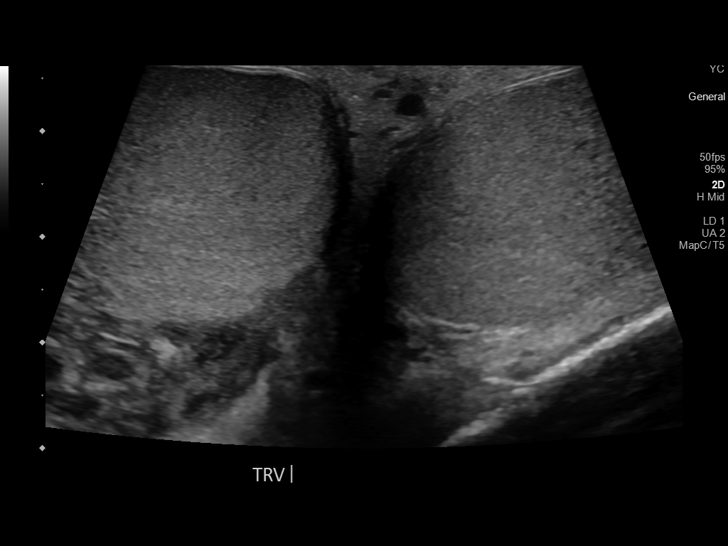
[im 7/73]
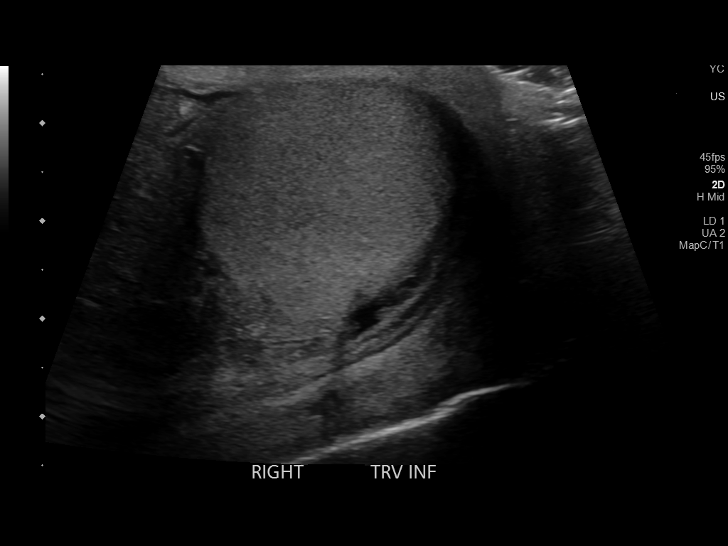
[im 13/73]
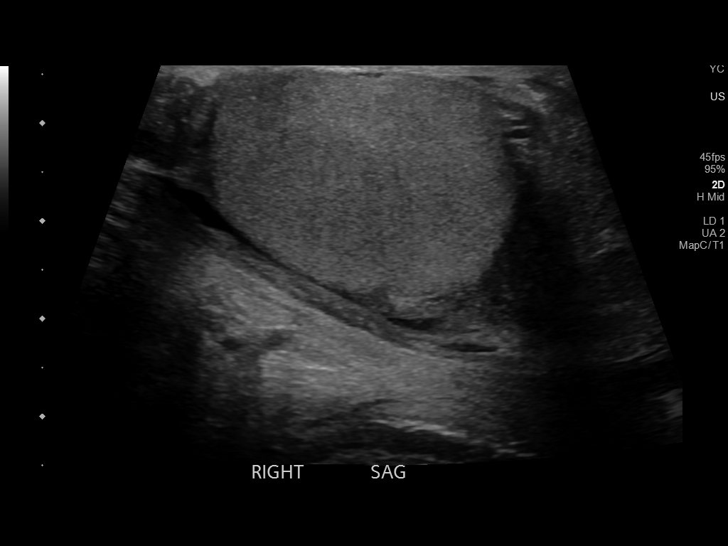
[im 19/73]
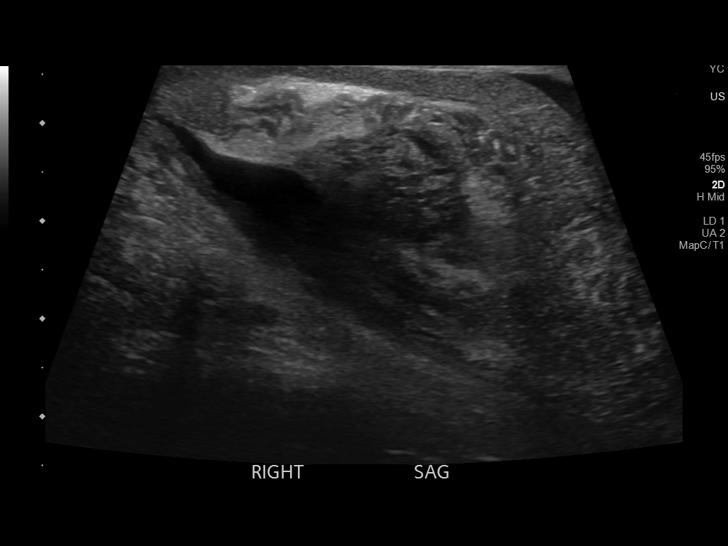
[im 25/73]
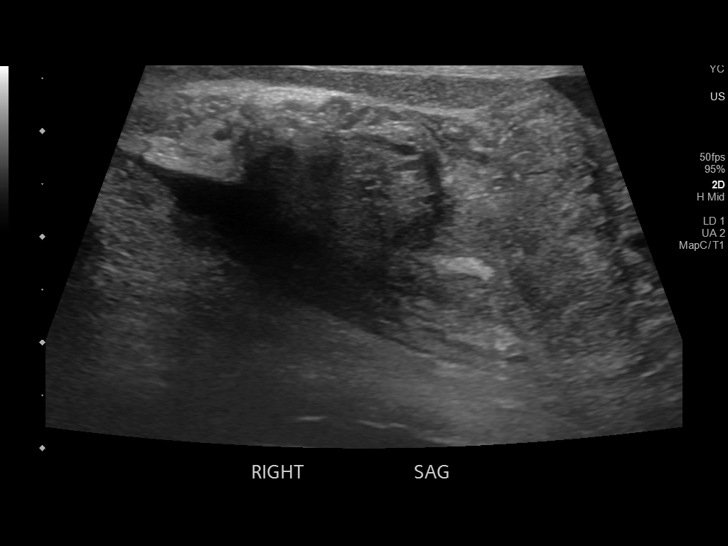
[im 28/73]
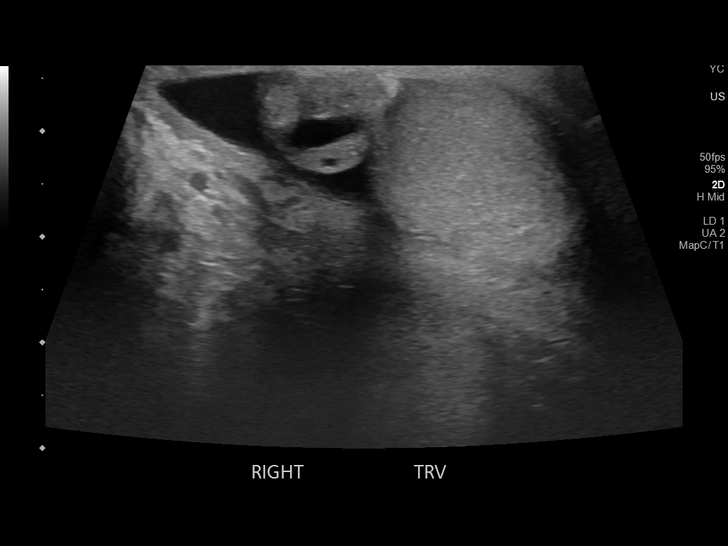
[im 34/73]
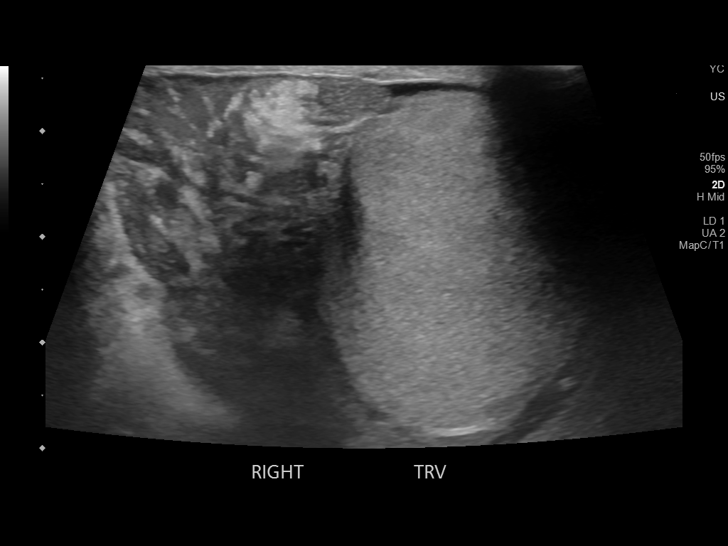
[im 40/73]
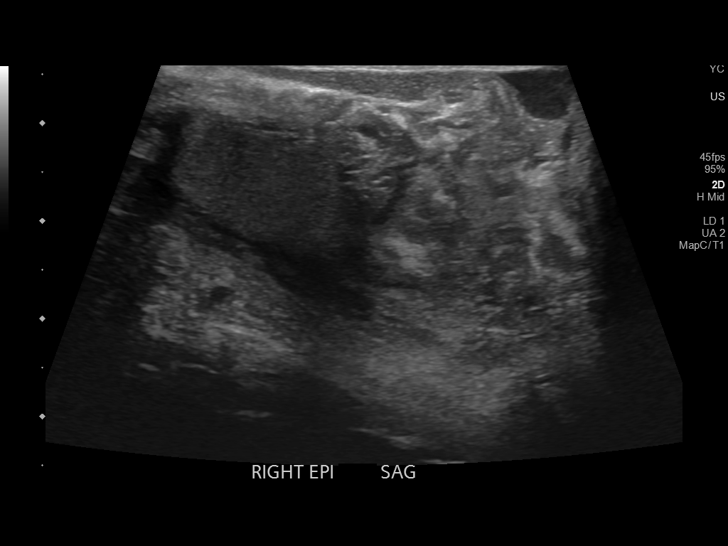
[im 46/73]
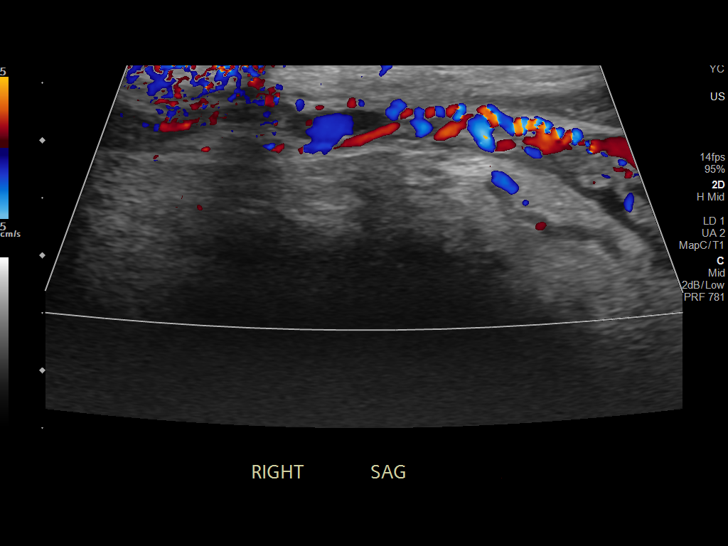
[im 49/73]
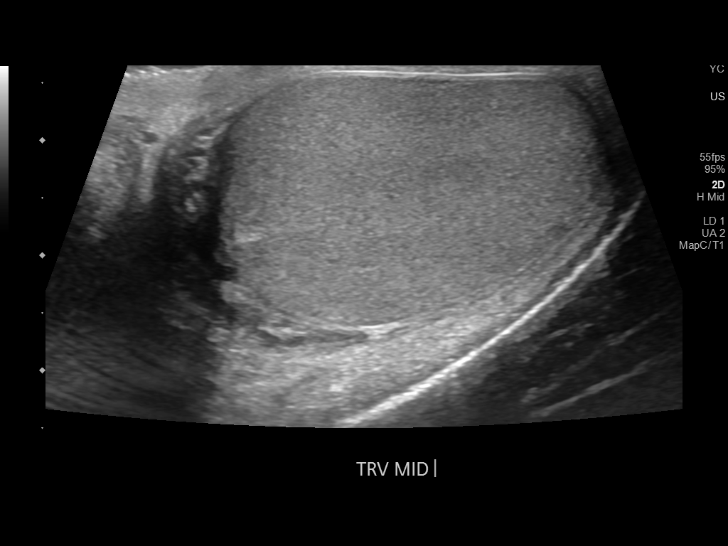
[im 55/73]
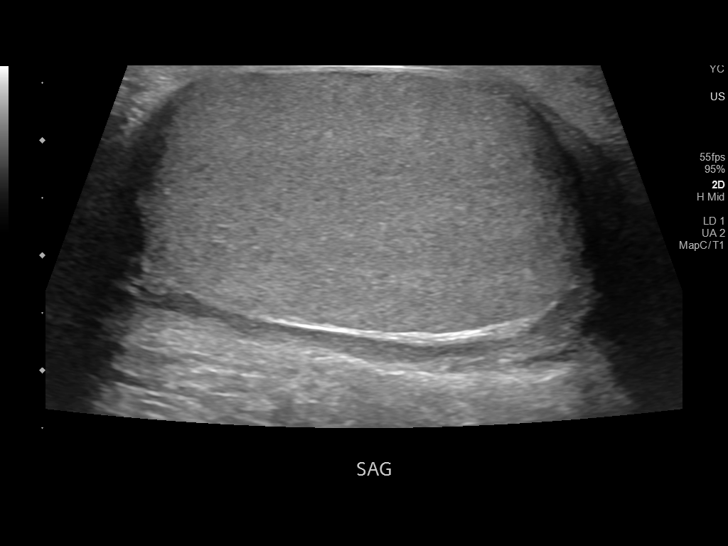
[im 61/73]
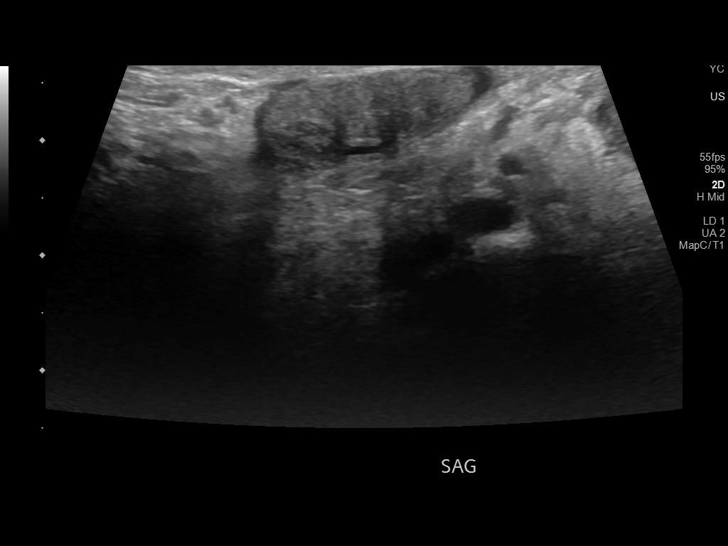
[im 67/73]
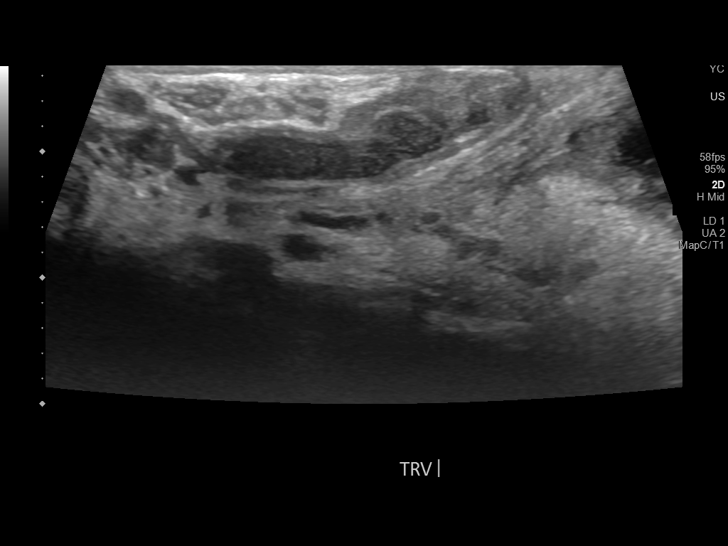
[im 73/73]
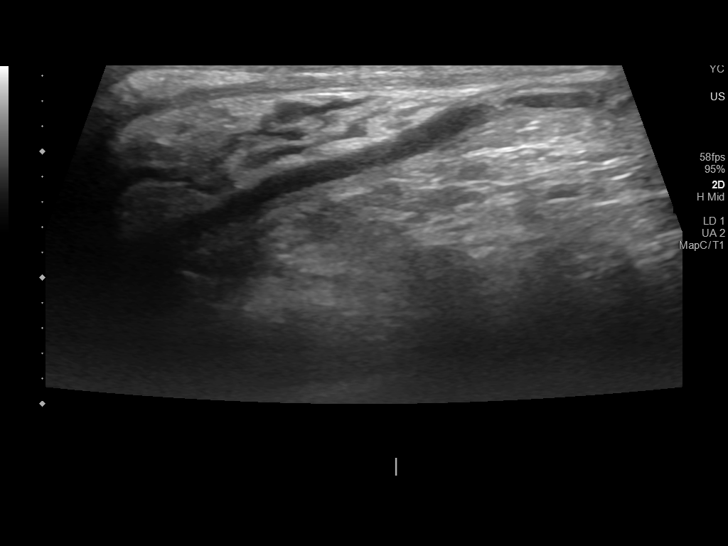

[14 of 25 positions shown; findings below may reference images not displayed]

FINDINGS: Right testicle

Measurements: 4.2 x 2.3 x 2.9 cm. No mass or microlithiasis
visualized.

Left testicle

Measurements: 4 x 2.2 x 3.2 cm. No mass or microlithiasis
visualized.

Right epididymis: Normal in size. 1.4 x 0.3 x 0.4 cm anechoic area
in the right epididymal head consistent with a and but did a
epididymal. Increased vascularity of the right epididymis relative
to the left.

Left epididymis:  Normal in size and appearance.

Hydrocele:  Small right hydrocele.

Varicocele:  None visualized.

Pulsed Doppler interrogation of both testes demonstrates normal low
resistance arterial and venous waveforms bilaterally.
IMPRESSION: 1. No testicular torsion.
2. Right epididymitis without evidence of orchitis. Small right
hydrocele.

## 2020-02-08 ENCOUNTER — Other Ambulatory Visit: Payer: Self-pay | Admitting: Family Medicine

## 2020-02-08 NOTE — Telephone Encounter (Signed)
Last office visit:  02/15/2019

## 2021-02-14 ENCOUNTER — Other Ambulatory Visit: Payer: Self-pay | Admitting: Family Medicine

## 2021-02-16 ENCOUNTER — Encounter: Payer: Self-pay | Admitting: Family Medicine

## 2021-02-16 ENCOUNTER — Ambulatory Visit (INDEPENDENT_AMBULATORY_CARE_PROVIDER_SITE_OTHER): Payer: Self-pay | Admitting: Family Medicine

## 2021-02-16 VITALS — BP 126/88 | HR 63 | Temp 98.5°F | Wt 180.0 lb

## 2021-02-16 DIAGNOSIS — F411 Generalized anxiety disorder: Secondary | ICD-10-CM

## 2021-02-16 MED ORDER — VENLAFAXINE HCL ER 150 MG PO CP24: ORAL_CAPSULE | ORAL | 3 refills | Status: DC

## 2021-02-16 MED ORDER — VENLAFAXINE HCL ER 75 MG PO CP24: ORAL_CAPSULE | ORAL | 3 refills | Status: DC

## 2021-02-16 NOTE — Progress Notes (Signed)
° °  Subjective:    Patient ID: Timothy Vega, male    DOB: Sep 14, 1990, 31 y.o.   MRN: [id number]  HPI Here to follow up on anxiety. He is doing well and he is pleased with the Effexor. No side effects to report.    Review of Systems  Constitutional: Negative.   Respiratory: Negative.    Cardiovascular: Negative.   Psychiatric/Behavioral: Negative.        Objective:   Physical Exam Constitutional:      Appearance: Normal appearance.  Cardiovascular:     Rate and Rhythm: Normal rate and regular rhythm.     Pulses: Normal pulses.     Heart sounds: Normal heart sounds.  Pulmonary:     Effort: Pulmonary effort is normal.     Breath sounds: Normal breath sounds.  Neurological:     Mental Status: He is alert.  Psychiatric:        Mood and Affect: Mood normal.        Behavior: Behavior normal.        Thought Content: Thought content normal.          Assessment & Plan:  Anxiety, stable. We refilled the Effexor XR. He will return soon for a well exam and labs.  Gershon Crane, MD

## 2021-03-06 ENCOUNTER — Ambulatory Visit (INDEPENDENT_AMBULATORY_CARE_PROVIDER_SITE_OTHER): Payer: Self-pay | Admitting: Family Medicine

## 2021-03-06 ENCOUNTER — Encounter: Payer: Self-pay | Admitting: Family Medicine

## 2021-03-06 VITALS — BP 128/82 | HR 84 | Temp 98.5°F | Ht 66.0 in | Wt 175.1 lb

## 2021-03-06 DIAGNOSIS — Z0000 Encounter for general adult medical examination without abnormal findings: Secondary | ICD-10-CM

## 2021-03-06 DIAGNOSIS — Z23 Encounter for immunization: Secondary | ICD-10-CM

## 2021-03-06 LAB — CBC WITH DIFFERENTIAL/PLATELET
Basophils Absolute: 0 10*3/uL (ref 0.0–0.1)
Basophils Relative: 0.7 % (ref 0.0–3.0)
Eosinophils Absolute: 0 10*3/uL (ref 0.0–0.7)
Eosinophils Relative: 0.6 % (ref 0.0–5.0)
HCT: 43.8 % (ref 39.0–52.0)
Hemoglobin: 14.6 g/dL (ref 13.0–17.0)
Lymphocytes Relative: 36.9 % (ref 12.0–46.0)
Lymphs Abs: 2.8 10*3/uL (ref 0.7–4.0)
MCHC: 33.3 g/dL (ref 30.0–36.0)
MCV: 91.2 fl (ref 78.0–100.0)
Monocytes Absolute: 0.6 10*3/uL (ref 0.1–1.0)
Monocytes Relative: 7.6 % (ref 3.0–12.0)
Neutro Abs: 4 10*3/uL (ref 1.4–7.7)
Neutrophils Relative %: 54.2 % (ref 43.0–77.0)
Platelets: 316 10*3/uL (ref 150.0–400.0)
RBC: 4.8 Mil/uL (ref 4.22–5.81)
RDW: 12.4 % (ref 11.5–15.5)
WBC: 7.5 10*3/uL (ref 4.0–10.5)

## 2021-03-06 LAB — BASIC METABOLIC PANEL
BUN: 12 mg/dL (ref 6–23)
CO2: 31 mEq/L (ref 19–32)
Calcium: 9.3 mg/dL (ref 8.4–10.5)
Chloride: 102 mEq/L (ref 96–112)
Creatinine, Ser: 1.01 mg/dL (ref 0.40–1.50)
GFR: 99.78 mL/min (ref >60.00)
Glucose, Bld: 89 mg/dL (ref 70–99)
Potassium: 4.5 mEq/L (ref 3.5–5.1)
Sodium: 138 mEq/L (ref 135–145)

## 2021-03-06 LAB — LIPID PANEL
Cholesterol: 208 mg/dL — ABNORMAL HIGH (ref 0–200)
HDL: 69.9 mg/dL (ref >39.00)
LDL Cholesterol: 110 mg/dL — ABNORMAL HIGH (ref 0–99)
NonHDL: 137.68
Total CHOL/HDL Ratio: 3
Triglycerides: 139 mg/dL (ref 0.0–149.0)
VLDL: 27.8 mg/dL (ref 0.0–40.0)

## 2021-03-06 LAB — HEPATIC FUNCTION PANEL
ALT: 19 U/L (ref 0–53)
AST: 22 U/L (ref 0–37)
Albumin: 4.8 g/dL (ref 3.5–5.2)
Alkaline Phosphatase: 71 U/L (ref 39–117)
Bilirubin, Direct: 0.1 mg/dL (ref 0.0–0.3)
Total Bilirubin: 0.6 mg/dL (ref 0.2–1.2)
Total Protein: 7.1 g/dL (ref 6.0–8.3)

## 2021-03-06 LAB — TSH: TSH: 0.73 u[IU]/mL (ref 0.35–5.50)

## 2021-03-06 LAB — HEMOGLOBIN A1C: Hgb A1c MFr Bld: 5.1 % (ref 4.6–6.5)

## 2021-03-06 NOTE — Progress Notes (Signed)
° °  Subjective:    Patient ID: Timothy Vega, male    DOB: 1990-11-03, 31 y.o.   MRN: [id number]  HPI Here for a well exam. He feels fine.    Review of Systems  Constitutional: Negative.   HENT: Negative.    Eyes: Negative.   Respiratory: Negative.    Cardiovascular: Negative.   Gastrointestinal: Negative.   Genitourinary: Negative.   Musculoskeletal: Negative.   Skin: Negative.   Neurological: Negative.   Psychiatric/Behavioral: Negative.        Objective:   Physical Exam Constitutional:      General: He is not in acute distress.    Appearance: Normal appearance. He is well-developed. He is not diaphoretic.  HENT:     Head: Normocephalic and atraumatic.     Right Ear: External ear normal.     Left Ear: External ear normal.     Nose: Nose normal.     Mouth/Throat:     Pharynx: No oropharyngeal exudate.  Eyes:     General: No scleral icterus.       Right eye: No discharge.        Left eye: No discharge.     Conjunctiva/sclera: Conjunctivae normal.     Pupils: Pupils are equal, round, and reactive to light.  Neck:     Thyroid: No thyromegaly.     Vascular: No JVD.     Trachea: No tracheal deviation.  Cardiovascular:     Rate and Rhythm: Normal rate and regular rhythm.     Heart sounds: Normal heart sounds. No murmur heard.   No friction rub. No gallop.  Pulmonary:     Effort: Pulmonary effort is normal. No respiratory distress.     Breath sounds: Normal breath sounds. No wheezing or rales.  Chest:     Chest wall: No tenderness.  Abdominal:     General: Bowel sounds are normal. There is no distension.     Palpations: Abdomen is soft. There is no mass.     Tenderness: There is no abdominal tenderness. There is no guarding or rebound.  Genitourinary:    Penis: Normal. No tenderness.      Testes: Normal.  Musculoskeletal:        General: No tenderness. Normal range of motion.     Cervical back: Neck supple.  Lymphadenopathy:     Cervical: No cervical  adenopathy.  Skin:    General: Skin is warm and dry.     Coloration: Skin is not pale.     Findings: No erythema or rash.  Neurological:     Mental Status: He is alert and oriented to person, place, and time.     Cranial Nerves: No cranial nerve deficit.     Motor: No abnormal muscle tone.     Coordination: Coordination normal.     Deep Tendon Reflexes: Reflexes are normal and symmetric. Reflexes normal.  Psychiatric:        Behavior: Behavior normal.        Thought Content: Thought content normal.        Judgment: Judgment normal.          Assessment & Plan:  Well exam. We discussed diet and exercise. Get fasting labs. Gershon Crane, MD

## 2021-03-06 NOTE — Addendum Note (Signed)
Addended by: Wyvonne Lenz on: 03/06/2021 11:49 AM   Modules accepted: Orders

## 2022-02-10 ENCOUNTER — Other Ambulatory Visit: Payer: Self-pay | Admitting: Family Medicine

## 2023-03-27 ENCOUNTER — Other Ambulatory Visit: Payer: Self-pay | Admitting: Family Medicine

## 2023-03-31 ENCOUNTER — Telehealth: Payer: Self-pay

## 2023-03-31 NOTE — Telephone Encounter (Signed)
 Copied from CRM [id number]. Topic: Appointments - Appointment Scheduling >> Mar 31, 2023 12:29 PM Alcus Dad wrote: Patient/patient representative is calling to schedule an appointment.Patient needs appt. Before medication refill. Patient stated that he can not go too long without his pain medication. He can barely drive with his hands. Please call back with more info

## 2023-04-02 NOTE — Telephone Encounter (Signed)
 Pt has a medication refill appointment with Dr Clent Ridges on 04/07/23

## 2023-04-07 ENCOUNTER — Encounter: Payer: Self-pay | Admitting: Family Medicine

## 2023-04-07 ENCOUNTER — Ambulatory Visit (INDEPENDENT_AMBULATORY_CARE_PROVIDER_SITE_OTHER): Payer: Self-pay | Admitting: Family Medicine

## 2023-04-07 VITALS — BP 128/80 | HR 66 | Temp 98.1°F | Ht 66.0 in | Wt 174.0 lb

## 2023-04-07 DIAGNOSIS — F411 Generalized anxiety disorder: Secondary | ICD-10-CM

## 2023-04-07 MED ORDER — VENLAFAXINE HCL ER 150 MG PO CP24: ORAL_CAPSULE | ORAL | 3 refills | Status: AC

## 2023-04-07 MED ORDER — VENLAFAXINE HCL ER 75 MG PO CP24: ORAL_CAPSULE | ORAL | 3 refills | Status: AC

## 2023-04-07 NOTE — Progress Notes (Signed)
   Subjective:    Patient ID: Timothy Vega, male    DOB: 1990/11/14, 33 y.o.   MRN: [id number]  HPI Here to follow up on anxiety. He is taking a total of 225 mg of Effexor XR daily, and he is very pleased with its effects. His moods are good. His sleep and appetite are good.    Review of Systems  Constitutional: Negative.   Respiratory: Negative.    Cardiovascular: Negative.   Psychiatric/Behavioral: Negative.         Objective:   Physical Exam Constitutional:      Appearance: Normal appearance.  Cardiovascular:     Rate and Rhythm: Normal rate.     Pulses: Normal pulses.     Heart sounds: Normal heart sounds.  Pulmonary:     Effort: Pulmonary effort is normal.     Breath sounds: Normal breath sounds.  Neurological:     Mental Status: He is alert.  Psychiatric:        Mood and Affect: Mood normal.        Behavior: Behavior normal.        Thought Content: Thought content normal.           Assessment & Plan:  His anxiety is well controlled. We refilled the Effexor XR for another year.  Gershon Crane, MD

## 2023-08-19 ENCOUNTER — Encounter: Payer: Self-pay | Admitting: Family Medicine

## 2023-08-19 ENCOUNTER — Ambulatory Visit (INDEPENDENT_AMBULATORY_CARE_PROVIDER_SITE_OTHER): Admitting: Family Medicine

## 2023-08-19 VITALS — BP 126/78 | HR 73 | Temp 98.6°F | Ht 60.25 in | Wt 164.8 lb

## 2023-08-19 DIAGNOSIS — Z131 Encounter for screening for diabetes mellitus: Secondary | ICD-10-CM

## 2023-08-19 DIAGNOSIS — Z13220 Encounter for screening for lipoid disorders: Secondary | ICD-10-CM | POA: Diagnosis not present

## 2023-08-19 DIAGNOSIS — Z0000 Encounter for general adult medical examination without abnormal findings: Secondary | ICD-10-CM

## 2023-08-19 LAB — HEMOGLOBIN A1C: Hgb A1c MFr Bld: 5.5 % (ref 4.6–6.5)

## 2023-08-19 LAB — CBC WITH DIFFERENTIAL/PLATELET
Basophils Absolute: 0 K/uL (ref 0.0–0.1)
Basophils Relative: 0.6 % (ref 0.0–3.0)
Eosinophils Absolute: 0 K/uL (ref 0.0–0.7)
Eosinophils Relative: 0.6 % (ref 0.0–5.0)
HCT: 47.2 % (ref 39.0–52.0)
Hemoglobin: 15.9 g/dL (ref 13.0–17.0)
Lymphocytes Relative: 27.6 % (ref 12.0–46.0)
Lymphs Abs: 1.7 K/uL (ref 0.7–4.0)
MCHC: 33.7 g/dL (ref 30.0–36.0)
MCV: 90.1 fl (ref 78.0–100.0)
Monocytes Absolute: 0.5 K/uL (ref 0.1–1.0)
Monocytes Relative: 7.3 % (ref 3.0–12.0)
Neutro Abs: 4 K/uL (ref 1.4–7.7)
Neutrophils Relative %: 63.9 % (ref 43.0–77.0)
Platelets: 314 K/uL (ref 150.0–400.0)
RBC: 5.24 Mil/uL (ref 4.22–5.81)
RDW: 12.7 % (ref 11.5–15.5)
WBC: 6.3 K/uL (ref 4.0–10.5)

## 2023-08-19 LAB — HEPATIC FUNCTION PANEL
ALT: 15 U/L (ref 0–53)
AST: 17 U/L (ref 0–37)
Albumin: 4.7 g/dL (ref 3.5–5.2)
Alkaline Phosphatase: 56 U/L (ref 39–117)
Bilirubin, Direct: 0.1 mg/dL (ref 0.0–0.3)
Total Bilirubin: 0.7 mg/dL (ref 0.2–1.2)
Total Protein: 7.3 g/dL (ref 6.0–8.3)

## 2023-08-19 LAB — BASIC METABOLIC PANEL WITH GFR
BUN: 13 mg/dL (ref 6–23)
CO2: 29 meq/L (ref 19–32)
Calcium: 9.6 mg/dL (ref 8.4–10.5)
Chloride: 99 meq/L (ref 96–112)
Creatinine, Ser: 1.03 mg/dL (ref 0.40–1.50)
GFR: 95.79 mL/min (ref >60.00)
Glucose, Bld: 101 mg/dL — ABNORMAL HIGH (ref 70–99)
Potassium: 4.2 meq/L (ref 3.5–5.1)
Sodium: 136 meq/L (ref 135–145)

## 2023-08-19 LAB — LIPID PANEL
Cholesterol: 218 mg/dL — ABNORMAL HIGH (ref 0–200)
HDL: 80.6 mg/dL (ref >39.00)
LDL Cholesterol: 117 mg/dL — ABNORMAL HIGH (ref 0–99)
NonHDL: 136.96
Total CHOL/HDL Ratio: 3
Triglycerides: 102 mg/dL (ref 0.0–149.0)
VLDL: 20.4 mg/dL (ref 0.0–40.0)

## 2023-08-19 LAB — TSH: TSH: 0.44 u[IU]/mL (ref 0.35–5.50)

## 2023-08-19 NOTE — Progress Notes (Signed)
   Subjective:    Patient ID: Timothy Vega, male    DOB: 1990-03-09, 33 y.o.   MRN: [id number]  HPI Here for a well exam. He feels great. His Effexor  XR is still working very well for him.     Review of Systems  Constitutional: Negative.   HENT: Negative.    Eyes: Negative.   Respiratory: Negative.    Cardiovascular: Negative.   Gastrointestinal: Negative.   Genitourinary: Negative.   Musculoskeletal: Negative.   Skin: Negative.   Neurological: Negative.   Psychiatric/Behavioral: Negative.         Objective:   Physical Exam Constitutional:      General: He is not in acute distress.    Appearance: Normal appearance. He is well-developed. He is not diaphoretic.  HENT:     Head: Normocephalic and atraumatic.     Right Ear: External ear normal.     Left Ear: External ear normal.     Nose: Nose normal.     Mouth/Throat:     Pharynx: No oropharyngeal exudate.  Eyes:     General: No scleral icterus.       Right eye: No discharge.        Left eye: No discharge.     Conjunctiva/sclera: Conjunctivae normal.     Pupils: Pupils are equal, round, and reactive to light.  Neck:     Thyroid: No thyromegaly.     Vascular: No JVD.     Trachea: No tracheal deviation.  Cardiovascular:     Rate and Rhythm: Normal rate and regular rhythm.     Pulses: Normal pulses.     Heart sounds: Normal heart sounds. No murmur heard.    No friction rub. No gallop.  Pulmonary:     Effort: Pulmonary effort is normal. No respiratory distress.     Breath sounds: Normal breath sounds. No wheezing or rales.  Chest:     Chest wall: No tenderness.  Abdominal:     General: Bowel sounds are normal. There is no distension.     Palpations: Abdomen is soft. There is no mass.     Tenderness: There is no abdominal tenderness. There is no guarding or rebound.  Genitourinary:    Penis: Normal. No tenderness.      Testes: Normal.  Musculoskeletal:        General: No tenderness. Normal range of motion.      Cervical back: Neck supple.  Lymphadenopathy:     Cervical: No cervical adenopathy.  Skin:    General: Skin is warm and dry.     Coloration: Skin is not pale.     Findings: No erythema or rash.  Neurological:     General: No focal deficit present.     Mental Status: He is alert and oriented to person, place, and time.     Cranial Nerves: No cranial nerve deficit.     Motor: No abnormal muscle tone.     Coordination: Coordination normal.     Deep Tendon Reflexes: Reflexes are normal and symmetric. Reflexes normal.  Psychiatric:        Mood and Affect: Mood normal.        Behavior: Behavior normal.        Thought Content: Thought content normal.        Judgment: Judgment normal.           Assessment & Plan:  Well exam. We discussed diet and exercise. Get fasting labs. Garnette Olmsted, MD

## 2023-08-21 ENCOUNTER — Ambulatory Visit: Payer: Self-pay | Admitting: Family Medicine
